# Patient Record
Sex: Female | Born: 1995 | Race: Black or African American | Hispanic: No | Marital: Single | Smoking: Never smoker
Health system: Southern US, Community
[De-identification: ages and names within clinical notes are randomized; demographics above are authoritative.]

## PROBLEM LIST (undated history)

## (undated) DIAGNOSIS — O98719 Human immunodeficiency virus [HIV] disease complicating pregnancy, unspecified trimester: Secondary | ICD-10-CM

## (undated) HISTORY — DX: Human immunodeficiency virus (HIV) disease complicating pregnancy, unspecified trimester: O98.719

---
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
## 2014-02-27 ENCOUNTER — Ambulatory Visit: Payer: Medicaid Other | Admitting: Infectious Diseases

## 2014-03-13 DIAGNOSIS — B20 Human immunodeficiency virus [HIV] disease: Secondary | ICD-10-CM

## 2014-06-15 DIAGNOSIS — B20 Human immunodeficiency virus [HIV] disease: Secondary | ICD-10-CM

## 2014-11-02 DIAGNOSIS — B20 Human immunodeficiency virus [HIV] disease: Secondary | ICD-10-CM

## 2015-02-14 DIAGNOSIS — B20 Human immunodeficiency virus [HIV] disease: Secondary | ICD-10-CM | POA: Diagnosis not present

## 2015-08-08 DIAGNOSIS — B20 Human immunodeficiency virus [HIV] disease: Secondary | ICD-10-CM | POA: Diagnosis not present

## 2015-08-30 ENCOUNTER — Encounter: Payer: Self-pay | Admitting: Infectious Disease

## 2015-12-03 DIAGNOSIS — B20 Human immunodeficiency virus [HIV] disease: Secondary | ICD-10-CM | POA: Diagnosis not present

## 2016-06-05 DIAGNOSIS — B20 Human immunodeficiency virus [HIV] disease: Secondary | ICD-10-CM | POA: Diagnosis not present

## 2016-11-17 NOTE — L&D Delivery Note (Signed)
  Patient is 21 y.o. G1P0 [redacted]w[redacted]d admitted in [facility], hx of HIV with undetectable viral load.   Delivery Note At 1:58 AM a viable female was delivered via Vaginal, Spontaneous Delivery (Presentation: ROA; vertex ).  APGAR: 9, 9; weight pending.   Placenta status: delivered spontaneously, in tact. Cord: 3vc with the following complications: none .  Cord pH: not sent  Anesthesia:  epidural Episiotomy: None Lacerations: Sulcus Suture Repair: 3.0 monocryl Est. Blood Loss (mL): 350  Mom to postpartum.  Baby to Couplet care / Skin to Skin.  Tillman Sers 07/13/2017, 2:30 AM      Upon arrival patient was complete and pushing. She pushed with good maternal effort to deliver a healthy baby boy. Baby delivered without difficulty, was noted to have good tone and place on maternal abdomen for oral suctioning, drying and stimulation. Delayed cord clamping performed. Placenta delivered intact with 3V cord. Vaginal canal and perineum was inspected and found to have sulcus tear; hemostatic upon repair. Pitocin was started and uterus massaged until bleeding slowed. Counts of sharps, instruments, and lap pads were all correct.   Tillman Sers, DO PGY-2 8/27/20182:30 AM

## 2016-12-10 DIAGNOSIS — B20 Human immunodeficiency virus [HIV] disease: Secondary | ICD-10-CM | POA: Diagnosis not present

## 2017-01-19 LAB — OB RESULTS CONSOLE HEPATITIS B SURFACE ANTIGEN: Hepatitis B Surface Ag: NEGATIVE

## 2017-01-19 LAB — HIV-1 RNA, QUALITATIVE, TMA: HIV-1 RNA, Qualitative, TMA: POSITIVE

## 2017-01-19 LAB — OB RESULTS CONSOLE HGB/HCT, BLOOD
HEMATOCRIT: 36 %
HEMOGLOBIN: 12 g/dL

## 2017-01-19 LAB — OB RESULTS CONSOLE RUBELLA ANTIBODY, IGM: RUBELLA: IMMUNE

## 2017-01-19 LAB — OB RESULTS CONSOLE ANTIBODY SCREEN: ANTIBODY SCREEN: NEGATIVE

## 2017-01-19 LAB — OB RESULTS CONSOLE HIV ANTIBODY (ROUTINE TESTING): HIV: REACTIVE

## 2017-01-19 LAB — OB RESULTS CONSOLE RPR: RPR: NONREACTIVE

## 2017-01-19 LAB — OB RESULTS CONSOLE ABO/RH: RH Type: POSITIVE

## 2017-01-19 LAB — OB RESULTS CONSOLE VARICELLA ZOSTER ANTIBODY, IGG: Varicella: NON-IMMUNE/NOT IMMUNE

## 2017-01-27 DIAGNOSIS — B20 Human immunodeficiency virus [HIV] disease: Secondary | ICD-10-CM | POA: Diagnosis not present

## 2017-02-12 ENCOUNTER — Telehealth: Payer: Self-pay | Admitting: *Deleted

## 2017-02-12 ENCOUNTER — Ambulatory Visit: Payer: Medicaid Other | Admitting: Internal Medicine

## 2017-02-12 DIAGNOSIS — O98719 Human immunodeficiency virus [HIV] disease complicating pregnancy, unspecified trimester: Secondary | ICD-10-CM

## 2017-02-12 NOTE — Telephone Encounter (Signed)
Patient left message in triage, apologized for not having a ride and cancelling her appointment today. She would like Dr Drue SecondSnider to know [facility]   She is trying to follow up on a OB referral here.  RN will place order for referral to high risk OB/GYN clinic - please cosign. Andree CossHowell, Patriciann Becht M, RN

## 2017-02-16 NOTE — Telephone Encounter (Signed)
I think Feliz Beam was working on the ob appt. Can you check with her.

## 2017-02-25 ENCOUNTER — Telehealth: Payer: Self-pay | Admitting: *Deleted

## 2017-02-25 NOTE — Telephone Encounter (Signed)
Information faxed to [facility] and awaiting an appointment for the patient.

## 2017-02-27 ENCOUNTER — Encounter: Payer: Self-pay | Admitting: *Deleted

## 2017-03-05 ENCOUNTER — Encounter: Payer: Self-pay | Admitting: *Deleted

## 2017-03-05 DIAGNOSIS — O0990 Supervision of high risk pregnancy, unspecified, unspecified trimester: Secondary | ICD-10-CM

## 2017-03-05 DIAGNOSIS — O98719 Human immunodeficiency virus [HIV] disease complicating pregnancy, unspecified trimester: Secondary | ICD-10-CM

## 2017-03-05 DIAGNOSIS — B20 Human immunodeficiency virus [HIV] disease: Secondary | ICD-10-CM | POA: Insufficient documentation

## 2017-03-05 HISTORY — DX: Human immunodeficiency virus (HIV) disease complicating pregnancy, unspecified trimester: O98.719

## 2017-03-11 DIAGNOSIS — B20 Human immunodeficiency virus [HIV] disease: Secondary | ICD-10-CM | POA: Diagnosis not present

## 2017-03-16 ENCOUNTER — Ambulatory Visit: Payer: Medicaid Other | Admitting: Internal Medicine

## 2017-03-17 ENCOUNTER — Ambulatory Visit: Payer: Medicaid Other | Admitting: Internal Medicine

## 2017-03-17 ENCOUNTER — Other Ambulatory Visit (HOSPITAL_COMMUNITY)
Admission: RE | Admit: 2017-03-17 | Discharge: 2017-03-17 | Disposition: A | Discharge status: Home / Self Care | Payer: Medicaid Other | Source: Ambulatory Visit | Attending: Obstetrics and Gynecology | Admitting: Obstetrics and Gynecology

## 2017-03-17 ENCOUNTER — Encounter: Payer: Self-pay | Admitting: Obstetrics and Gynecology

## 2017-03-17 ENCOUNTER — Ambulatory Visit (INDEPENDENT_AMBULATORY_CARE_PROVIDER_SITE_OTHER): Payer: Medicaid Other | Admitting: Obstetrics and Gynecology

## 2017-03-17 VITALS — BP 131/82 | HR 108 | Ht 63.0 in | Wt 163.1 lb

## 2017-03-17 DIAGNOSIS — O98719 Human immunodeficiency virus [HIV] disease complicating pregnancy, unspecified trimester: Secondary | ICD-10-CM

## 2017-03-17 DIAGNOSIS — Z3A21 21 weeks gestation of pregnancy: Secondary | ICD-10-CM | POA: Diagnosis not present

## 2017-03-17 DIAGNOSIS — Z113 Encounter for screening for infections with a predominantly sexual mode of transmission: Secondary | ICD-10-CM | POA: Diagnosis not present

## 2017-03-17 DIAGNOSIS — O0992 Supervision of high risk pregnancy, unspecified, second trimester: Secondary | ICD-10-CM

## 2017-03-17 DIAGNOSIS — O0990 Supervision of high risk pregnancy, unspecified, unspecified trimester: Secondary | ICD-10-CM

## 2017-03-17 DIAGNOSIS — Z21 Asymptomatic human immunodeficiency virus [HIV] infection status: Secondary | ICD-10-CM

## 2017-03-17 LAB — POCT URINALYSIS DIP (DEVICE)
Bilirubin Urine: NEGATIVE
GLUCOSE, UA: NEGATIVE mg/dL
Hgb urine dipstick: NEGATIVE
Nitrite: NEGATIVE
Protein, ur: NEGATIVE mg/dL
SPECIFIC GRAVITY, URINE: 1.02 (ref 1.005–1.030)
Urobilinogen, UA: 1 mg/dL (ref 0.0–1.0)
pH: 7 (ref 5.0–8.0)

## 2017-03-17 NOTE — Patient Instructions (Signed)

## 2017-03-17 NOTE — Progress Notes (Signed)
   PRENATAL VISIT NOTE  Subjective:  Beth George is a 21 y.o. G1P0 at [redacted]w[redacted]d being seen today for Initial  prenatal care.  She is currently monitored for the following issues for this high-risk pregnancy and has HIV disease affecting pregnancy and Supervision of high risk pregnancy, antepartum on her problem list.  Patient reports no complaints.  Contractions: Not present. Vag. Bleeding: None.  Movement: Present. Denies leaking of fluid.   Patient is HIV positive. She is on medication and following with ID at [facility].   The following portions of the patient's history were reviewed and updated as appropriate: allergies, current medications, past family history, past medical history, past social history, past surgical history and problem list. Problem list updated.  Objective:   Vitals:   03/17/17 1522 03/17/17 1524  BP: 131/82   Pulse: (!) 108   Weight: 163 lb 1.6 oz (74 kg)   Height:   (1.6 m)    Fetal Status: Fetal Heart Rate (bpm): 154   Movement: Present     General:  Alert, oriented and cooperative. Patient is in no acute distress.  Skin: Skin is warm and dry. No rash noted.   Cardiovascular: Normal heart rate noted  Respiratory: Normal respiratory effort, no problems with respiration noted  Abdomen: Soft, gravid, appropriate for gestational age. Pain/Pressure: Absent     Pelvic:  Cervical exam deferred        Extremities: Normal range of motion.  Edema: None  Mental Status: Normal mood and affect. Normal behavior. Normal judgment and thought content.   Assessment and Plan:  Pregnancy: G1P0 at [redacted]w[redacted]d  1. Supervision of high risk pregnancy, antepartum - GC/Chlamydia probe amp ([place])not at [facility] OB COMP + 14 WK; Future  2. HIV in pregnancy -on medication -CD4 count 586 -following at [facility]  Preterm labor symptoms and general obstetric precautions including but not limited to vaginal bleeding, contractions, leaking of fluid and  fetal movement were reviewed in detail with the patient. Please refer to After Visit Summary for other counseling recommendations.  No Follow-up on file.   Lorne Skeens, MD

## 2017-03-17 NOTE — Progress Notes (Signed)
Here for first visit- transferring care from [facility] clinic.  Given prenatal education booklets. Signed up for babyscripps app.

## 2017-03-20 LAB — GC/CHLAMYDIA PROBE AMP (~~LOC~~) NOT AT ARMC
Chlamydia: NEGATIVE
Neisseria Gonorrhea: NEGATIVE

## 2017-03-30 ENCOUNTER — Other Ambulatory Visit: Payer: Self-pay | Admitting: *Deleted

## 2017-03-30 ENCOUNTER — Encounter: Payer: Self-pay | Admitting: *Deleted

## 2017-03-30 DIAGNOSIS — O0992 Supervision of high risk pregnancy, unspecified, second trimester: Secondary | ICD-10-CM

## 2017-03-30 DIAGNOSIS — O98719 Human immunodeficiency virus [HIV] disease complicating pregnancy, unspecified trimester: Secondary | ICD-10-CM

## 2017-03-30 NOTE — Progress Notes (Signed)
[facility] pre[facility] US ordered ([id number] Ob complete +14 wks). The memo stated that their physician recommended a different study ([id number]  Ob detail +14 wks) based on clinical information provided. Per the [facility] website, the original study [phone]([id number]) has been denied for pre-authorization. I placed new order for study [id number] as recommended and completed pre-auth as well as notifying MFM dept.

## 2017-04-02 ENCOUNTER — Ambulatory Visit (HOSPITAL_COMMUNITY)
Admission: RE | Admit: 2017-04-02 | Discharge: 2017-04-02 | Disposition: A | Discharge status: Home / Self Care | Payer: Medicaid Other | Source: Ambulatory Visit | Attending: Obstetrics and Gynecology | Admitting: Obstetrics and Gynecology

## 2017-04-02 DIAGNOSIS — O98712 Human immunodeficiency virus [HIV] disease complicating pregnancy, second trimester: Secondary | ICD-10-CM | POA: Insufficient documentation

## 2017-04-02 DIAGNOSIS — Z363 Encounter for antenatal screening for malformations: Secondary | ICD-10-CM | POA: Insufficient documentation

## 2017-04-02 DIAGNOSIS — O98719 Human immunodeficiency virus [HIV] disease complicating pregnancy, unspecified trimester: Secondary | ICD-10-CM

## 2017-04-02 DIAGNOSIS — Z3A24 24 weeks gestation of pregnancy: Secondary | ICD-10-CM | POA: Insufficient documentation

## 2017-04-02 DIAGNOSIS — O0992 Supervision of high risk pregnancy, unspecified, second trimester: Secondary | ICD-10-CM | POA: Insufficient documentation

## 2017-04-15 ENCOUNTER — Ambulatory Visit (INDEPENDENT_AMBULATORY_CARE_PROVIDER_SITE_OTHER): Payer: Medicaid Other | Admitting: Advanced Practice Midwife

## 2017-04-15 ENCOUNTER — Encounter: Payer: Self-pay | Admitting: Advanced Practice Midwife

## 2017-04-15 ENCOUNTER — Ambulatory Visit (INDEPENDENT_AMBULATORY_CARE_PROVIDER_SITE_OTHER): Payer: Medicaid Other | Admitting: Clinical

## 2017-04-15 VITALS — BP 127/80 | HR 96 | Wt 167.5 lb

## 2017-04-15 DIAGNOSIS — Z048 Encounter for examination and observation for other specified reasons: Secondary | ICD-10-CM | POA: Diagnosis not present

## 2017-04-15 DIAGNOSIS — O0990 Supervision of high risk pregnancy, unspecified, unspecified trimester: Secondary | ICD-10-CM

## 2017-04-15 DIAGNOSIS — O99342 Other mental disorders complicating pregnancy, second trimester: Secondary | ICD-10-CM

## 2017-04-15 DIAGNOSIS — IMO0002 Reserved for concepts with insufficient information to code with codable children: Secondary | ICD-10-CM

## 2017-04-15 DIAGNOSIS — F4322 Adjustment disorder with anxiety: Secondary | ICD-10-CM | POA: Diagnosis not present

## 2017-04-15 DIAGNOSIS — F419 Anxiety disorder, unspecified: Secondary | ICD-10-CM

## 2017-04-15 DIAGNOSIS — Z0489 Encounter for examination and observation for other specified reasons: Secondary | ICD-10-CM

## 2017-04-15 DIAGNOSIS — O98712 Human immunodeficiency virus [HIV] disease complicating pregnancy, second trimester: Secondary | ICD-10-CM | POA: Diagnosis not present

## 2017-04-15 DIAGNOSIS — O0992 Supervision of high risk pregnancy, unspecified, second trimester: Secondary | ICD-10-CM | POA: Diagnosis not present

## 2017-04-15 DIAGNOSIS — O98719 Human immunodeficiency virus [HIV] disease complicating pregnancy, unspecified trimester: Secondary | ICD-10-CM

## 2017-04-15 DIAGNOSIS — Z21 Asymptomatic human immunodeficiency virus [HIV] infection status: Secondary | ICD-10-CM

## 2017-04-15 NOTE — BH Specialist Note (Addendum)
Integrated Behavioral Health Initial Visit  MRN: [id number] Name: Beth LopesJameka L George   Session Start time: 11:50 Session End time: 12:10 Total time: 20 minutes  Type of Service: Integrated Behavioral Health- Individual/Family Interpretor:No. Interpretor Name and Language: n/a   Warm Hand Off Completed.       SUBJECTIVE: Beth George is a 21 y.o. female accompanied by patient, mother and nephew. Patient was referred by Beth George, CNM for anxiety. Patient reports the following symptoms/concerns: Pt states her primary concern is an increase in irritability and anxiousness this pregnancy; open to learning new strategy to cope, and being prepared for birth and postpartum. Duration of problem: Current pregnancy; Severity of problem: moderate  OBJECTIVE: Mood: Anxious and Irritable and Affect: Tearful Risk of harm to self or others: No plan to harm self or others   LIFE CONTEXT: Family and Social: Lives with family/mother School/Work: - Self-Care: - Life Changes: Current first pregnancy  GOALS ADDRESSED: Patient will reduce symptoms of: anxiety and irritability and increase knowledge and/or ability of: self-management skills and also: Increase healthy adjustment to current life circumstances   INTERVENTIONS: Mindfulness or Relaxation Training and Psychoeducation and/or Health Education  Standardized Assessments completed: GAD-7 and PHQ 9  ASSESSMENT: Patient currently experiencing Adjustment disorder with anxious mood. Patient may benefit from psychoeducation and brief therapeutic intervention regarding coping with symptoms of anxiety.  PLAN: 1. Follow up with behavioral health clinician on : Two weeks 2. Behavioral recommendations:  -Practice CALM relaxation breathing exercise every morning -Read educational materials regarding coping with symptoms of anxiety (and depression) -Use Postpartum Planner with family at home, to help in planning ahead for  postpartum -Consider apps for additional self-coping with feelings 3. Referral(s): [facility]  Beth George, [place]  Dep[facility] screen [facility] 03/17/2017  Decreased Interest 1  Down, Depressed, Hopeless 1  PHQ - 2 Score 2  Altered sleeping 2  Tired, decreased energy 2  Change in appetite 3  Feeling bad or failure about yourself  0  Trouble concentrating 1  Moving slowly or fidgety/restless 0  Suicidal thoughts 0  PHQ-9 Score 10   GAD 7 : Generalized Anxiety Score 03/17/2017  Nervous, Anxious, on Edge 0  Control/stop worrying 0  Worry too much - different things 0  Trouble relaxing 0  Restless 1  Easily annoyed or irritable 1  Afraid - awful might happen 0  Total GAD 7 Score 2

## 2017-04-15 NOTE — Patient Instructions (Addendum)
AREA PEDIATRIC/FAMILY PRACTICE PHYSICIANS  [facility] [address], [address] [place], [place]  [address] Phone - [phone]   Fax - [phone]  [facility] [address] [place], [place] [address] Phone - [phone]   Fax - [phone]  JACK AMOS [address] [place], [place]  [address] Phone - [phone]   Fax - [phone]  [facility] [address] [place], [place]  [address] Phone - [phone]   Fax - [phone]  [facility] [address] [place], [place]  [address] Phone - [phone]   Fax - [phone]  [facility] [address] [place], [place]  [address] Phone - [phone]   Fax - [phone]  [facility] [place] [address] [place], [place]  [address] Phone - [phone]   Fax - [phone]  [facility] AT [facility] [address] [place], [address] [place], [place]  [address] Phone - [phone]   Fax - [phone]  [facility] AT [facility] [address] [place], [place]  [address] Phone - [phone]   Fax - [phone] [facility] [place] [address] [place], [place]  [address] Phone - [phone]   Fax - [phone]  [facility] [address] [place], [place]  [address] Phone - [phone]   Fax - [phone]  [facility] [address] [place], [place]  [address] Phone - [phone]   Fax - [phone]  [facility] [address] [place], [place]  [address] Phone - [phone]   Fax - [phone]  [facility] [address] [place], [place]  [address] Phone - [phone]  [facility] [address] [place], [place]  [address] Phone - [phone]   Fax - [phone]  [facility] [address] [place], [place]  [address] Phone - [phone]   Fax - [phone]  [facility] [address] [place], [place]  [address] Phone - [phone]   Fax - [phone]  [facility] [address] [place], [place]  [address] Phone - [phone]   Fax - [phone]   [facility] [address] [place], [place]  [address] Phone - [phone]   Fax - [phone]  [facility] [address] [place], [place]  [address] Phone - [phone]   Fax - [phone]  DAVID RUBIN [address] [place], [place]  [address] Phone - [phone]   Fax - [phone]  [facility] [address] [place], [place]  [address] Phone - [phone]   Fax - [phone]  [facility] Alita Chyle [address] [place], [place]  [address] Phone - [phone]   Fax - [phone] Gerarda Fraction [address] [place], [place]  [address] Phone - [phone]   Fax - [phone]  [facility] [address] [place], [place]  [address] Phone - [phone]   Fax - [phone]  [facility] -  [address] [place], [place]  [address] Phone - [phone]   Fax - [phone]  [facility] -  Wyvonne Lenz MD [address] [place] [address] Phone [phone]  Fax [phone]  Places to have your son circumcised:    [facility] [phone] $[phone] by 4  wks  [facility] [phone] $244 by 4 wks  [facility] [phone] $175 by 2 wks  [facility] [phone] $250 by 7 days [facility] [phone] $150 by 4 wks  These prices sometimes change but are roughly what you can expect to pay. Please call and  confirm pricing.   Circumcision is considered an elective/non-medically necessary procedure. There are many reasons parents decide to have their sons circumsized. During the first year of life circumcised males have a reduced risk of urinary tract infections but after this year the rates between circumcised males and uncircumcised males are the same.  It is safe to have your son circumcised outside of the hospital and the places above perform them regularly.     Third Trimester of Pregnancy The third trimester is from week 28 through week 40 (months 7 through 9). The third trimester is a time when the unborn baby (fetus) is growing rapidly. At the end of the ninth month, the fetus is about 20 inches in length and weighs 6-10 pounds. Body changes during your third trimester Your body will continue to go through many changes during pregnancy. The changes vary from woman to woman. During the third trimester:  Your weight will continue to increase. You can expect to gain 25-35 pounds (11-16 kg) by the end of the pregnancy.  You may begin to get stretch marks on your hips, abdomen, and breasts.  You may urinate more often because the fetus is moving lower into your pelvis and pressing on your bladder.  You may develop or continue to have heartburn. This is caused by increased hormones that slow down muscles in the digestive tract.  You may develop or continue to have constipation because increased hormones slow digestion and cause the muscles that push waste through your intestines to relax.  You may develop hemorrhoids. These are swollen veins (varicose veins) in the rectum that can itch or be painful.  You may develop swollen, bulging veins (varicose veins) in your legs.  You may have increased body aches in the pelvis, back, or thighs. This is due to weight gain and increased hormones that are relaxing your joints.  You may have changes in your hair. These can include thickening of your  hair, rapid growth, and changes in texture. Some women also have hair loss during or after pregnancy, or hair that feels dry or thin. Your hair will most likely return to normal after your baby is born.  Your breasts will continue to grow and they will continue to become tender. A yellow fluid (colostrum) may leak from your breasts. This is the first milk you are producing for your baby.  Your belly button may stick out.  You may notice more swelling in your hands, face, or ankles.  You may have increased tingling or numbness in your hands, arms, and legs. The skin on your belly may also feel numb.  You may feel short of breath because of your expanding uterus.  You may have more problems sleeping. This can be caused by the size of your belly, increased need to urinate, and an increase in your body's metabolism.  You may notice the fetus "dropping," or moving lower in your abdomen (lightening).  You may have increased vaginal discharge.  You may notice your joints feel loose and you may have pain around your pelvic bone. What to expect at prenatal visits You will have prenatal exams every 2 weeks until week 36. Then you will have weekly prenatal exams. During a routine  prenatal visit:  You will be weighed to make sure you and the baby are growing normally.  Your blood pressure will be taken.  Your abdomen will be measured to track your baby's growth.  The fetal heartbeat will be listened to.  Any test results from the previous visit will be discussed.  You may have a cervical check near your due date to see if your cervix has softened or thinned (effaced).  You will be tested for Group B streptococcus. This happens between 35 and 37 weeks. Your health care provider may ask you:  What your birth plan is.  How you are feeling.  If you are feeling the baby move.  If you have had any abnormal symptoms, such as leaking fluid, bleeding, severe headaches, or abdominal  cramping.  If you are using any tobacco products, including cigarettes, chewing tobacco, and electronic cigarettes.  If you have any questions. Other tests or screenings that may be performed during your third trimester include:  Blood tests that check for low iron levels (anemia).  Fetal testing to check the health, activity level, and growth of the fetus. Testing is done if you have certain medical conditions or if there are problems during the pregnancy.  Nonstress test (NST). This test checks the health of your baby to make sure there are no signs of problems, such as the baby not getting enough oxygen. During this test, a belt is placed around your belly. The baby is made to move, and its heart rate is monitored during movement. What is false labor? False labor is a condition in which you feel small, irregular tightenings of the muscles in the womb (contractions) that usually go away with rest, changing position, or drinking water. These are called Braxton Hicks contractions. Contractions may last for hours, days, or even weeks before true labor sets in. If contractions come at regular intervals, become more frequent, increase in intensity, or become painful, you should see your health care provider. What are the signs of labor?  Abdominal cramps.  Regular contractions that start at 10 minutes apart and become stronger and more frequent with time.  Contractions that start on the top of the uterus and spread down to the lower abdomen and back.  Increased pelvic pressure and dull back pain.  A watery or bloody mucus discharge that comes from the vagina.  Leaking of amniotic fluid. This is also known as your "water breaking." It could be a slow trickle or a gush. Let your health care provider know if it has a color or strange odor. If you have any of these signs, call your health care provider right away, even if it is before your due date. Follow these instructions at home: Medicines    Follow your health care provider's instructions regarding medicine use. Specific medicines may be either safe or unsafe to take during pregnancy.  Take a prenatal vitamin that contains at least 600 micrograms (mcg) of folic acid.  If you develop constipation, try taking a stool softener if your health care provider approves. Eating and drinking   Eat a balanced diet that includes fresh fruits and vegetables, whole grains, good sources of protein such as meat, eggs, or tofu, and low-fat dairy. Your health care provider will help you determine the amount of weight gain that is right for you.  Avoid raw meat and uncooked cheese. These carry germs that can cause birth defects in the baby.  If you have low calcium intake from food, talk  to your health care provider about whether you should take a daily calcium supplement.  Eat four or five small meals rather than three large meals a day.  Limit foods that are high in fat and processed sugars, such as fried and sweet foods.  To prevent constipation:  Drink enough fluid to keep your urine clear or pale yellow.  Eat foods that are high in fiber, such as fresh fruits and vegetables, whole grains, and beans. Activity   Exercise only as directed by your health care provider. Most women can continue their usual exercise routine during pregnancy. Try to exercise for 30 minutes at least 5 days a week. Stop exercising if you experience uterine contractions.  Avoid heavy lifting.  Do not exercise in extreme heat or humidity, or at high altitudes.  Wear low-heel, comfortable shoes.  Practice good posture.  You may continue to have sex unless your health care provider tells you otherwise. Relieving pain and discomfort   Take frequent breaks and rest with your legs elevated if you have leg cramps or low back pain.  Take warm sitz baths to soothe any pain or discomfort caused by hemorrhoids. Use hemorrhoid cream if your health care provider  approves.  Wear a good support bra to prevent discomfort from breast tenderness.  If you develop varicose veins:  Wear support pantyhose or compression stockings as told by your healthcare provider.  Elevate your feet for 15 minutes, 3-4 times a day. Prenatal care   Write down your questions. Take them to your prenatal visits.  Keep all your prenatal visits as told by your health care provider. This is important. Safety   Wear your seat belt at all times when driving.  Make a list of emergency phone numbers, including numbers for family, friends, the hospital, and police and fire departments. General instructions   Avoid cat litter boxes and soil used by cats. These carry germs that can cause birth defects in the baby. If you have a cat, ask someone to clean the litter box for you.  Do not travel far distances unless it is absolutely necessary and only with the approval of your health care provider.  Do not use hot tubs, steam rooms, or saunas.  Do not drink alcohol.  Do not use any products that contain nicotine or tobacco, such as cigarettes and e-cigarettes. If you need help quitting, ask your health care provider.  Do not use any medicinal herbs or unprescribed drugs. These chemicals affect the formation and growth of the baby.  Do not douche or use tampons or scented sanitary pads.  Do not cross your legs for long periods of time.  To prepare for the arrival of your baby:  Take prenatal classes to understand, practice, and ask questions about labor and delivery.  Make a trial run to the hospital.  Visit the hospital and tour the maternity area.  Arrange for maternity or paternity leave through employers.  Arrange for family and friends to take care of pets while you are in the hospital.  Purchase a rear-facing car seat and make sure you know how to install it in your car.  Pack your hospital bag.  Prepare the baby's nursery. Make sure to remove all pillows  and stuffed animals from the baby's crib to prevent suffocation.  Visit your dentist if you have not gone during your pregnancy. Use a soft toothbrush to brush your teeth and be gentle when you floss. Contact a health care provider if:  You  are unsure if you are in labor or if your water has broken.  You become dizzy.  You have mild pelvic cramps, pelvic pressure, or nagging pain in your abdominal area.  You have lower back pain.  You have persistent nausea, vomiting, or diarrhea.  You have an unusual or bad smelling vaginal discharge.  You have pain when you urinate. Get help right away if:  Your water breaks before 37 weeks.  You have regular contractions less than 5 minutes apart before 37 weeks.  You have a fever.  You are leaking fluid from your vagina.  You have spotting or bleeding from your vagina.  You have severe abdominal pain or cramping.  You have rapid weight loss or weight gain.  You have shortness of breath with chest pain.  You notice sudden or extreme swelling of your face, hands, ankles, feet, or legs.  Your baby makes fewer than 10 movements in 2 hours.  You have severe headaches that do not go away when you take medicine.  You have vision changes. Summary  The third trimester is from week 28 through week 40, months 7 through 9. The third trimester is a time when the unborn baby (fetus) is growing rapidly.  During the third trimester, your discomfort may increase as you and your baby continue to gain weight. You may have abdominal, leg, and back pain, sleeping problems, and an increased need to urinate.  During the third trimester your breasts will keep growing and they will continue to become tender. A yellow fluid (colostrum) may leak from your breasts. This is the first milk you are producing for your baby.  False labor is a condition in which you feel small, irregular tightenings of the muscles in the womb (contractions) that eventually go  away. These are called Braxton Hicks contractions. Contractions may last for hours, days, or even weeks before true labor sets in.  Signs of labor can include: abdominal cramps; regular contractions that start at 10 minutes apart and become stronger and more frequent with time; watery or bloody mucus discharge that comes from the vagina; increased pelvic pressure and dull back pain; and leaking of amniotic fluid. This information is not intended to replace advice given to you by your health care provider. Make sure you discuss any questions you have with your health care provider. Document Released: 10/28/2001 Document Revised: 04/10/2016 Document Reviewed: 01/04/2013 Elsevier Interactive Patient Education  2017 [employer].

## 2017-04-15 NOTE — Progress Notes (Signed)
   PRENATAL VISIT NOTE  Subjective:  Beth George is a 21 y.o. G1P0 at 365w0d being seen today for ongoing prenatal care.  She is currently monitored for the following issues for this high-risk pregnancy and has HIV disease affecting pregnancy and Supervision of high risk pregnancy, antepartum on her problem list.  Patient reports boil on abd that popped.  Depression screening negative, but pt voiced crying easily, being more grumpy and wants to talk to [place].  Asking about waterbirth,  Contractions: Not present. Vag. Bleeding: None.  Movement: Present. Denies leaking of fluid.   The following portions of the patient's history were reviewed and updated as appropriate: allergies, current medications, past family history, past medical history, past social history, past surgical history and problem list. Problem list updated.  Objective:   Vitals:   04/15/17 1100  BP: 127/80  Pulse: 96  Weight: 167 lb 8 oz (76 kg)    Fetal Status: Fetal Heart Rate (bpm): 140 Fundal Height: 27 cm Movement: Present     General:  Alert, oriented and cooperative. Patient is in no acute distress.  Skin: Skin is warm and dry. No rash noted.   Cardiovascular: Normal heart rate noted  Respiratory: Normal respiratory effort, no problems with respiration noted  Abdomen: Soft, gravid, appropriate for gestational age. Pain/Pressure: Absent     Pelvic:  Cervical exam deferred        Extremities: Normal range of motion.  Edema: None  Mental Status: Normal mood and affect. Normal behavior. Normal judgment and thought content.   Assessment and Plan:  Pregnancy: G1P0 at 605w0d  1. Supervision of high risk pregnancy, antepartum - Warm hand-off to [place]   2. HIV disease affecting pregnancy, antepartum - Will call to reschedule appt w/ Dr. Drue SecondSnider [facility].  - US MFM OB FOLLOW UP; Future - Not a candidate for waterbirth, but discussed other options for comfort measures in labor. Recommend CBE, practice relaxation  techniques   3. Evaluate anatomy not seen on prior sonogram  - US MFM OB FOLLOW UP; Future    Preterm labor symptoms and general obstetric precautions including but not limited to vaginal bleeding, contractions, leaking of fluid and fetal movement were reviewed in detail with the patient. Please refer to After Visit Summary for other counseling recommendations.  Return in about 2 weeks (around 04/29/2017) for ROB/GTT.   Dorathy KinsmanVirginia Shanoah Asbill, CNM

## 2017-04-30 ENCOUNTER — Institutional Professional Consult (permissible substitution): Payer: Medicaid Other

## 2017-04-30 ENCOUNTER — Encounter: Payer: Medicaid Other | Admitting: Medical

## 2017-05-01 ENCOUNTER — Institutional Professional Consult (permissible substitution): Payer: Medicaid Other

## 2017-05-01 ENCOUNTER — Ambulatory Visit (HOSPITAL_COMMUNITY): Payer: Medicaid Other

## 2017-05-04 ENCOUNTER — Encounter (HOSPITAL_COMMUNITY): Payer: Self-pay

## 2017-05-04 ENCOUNTER — Ambulatory Visit (HOSPITAL_COMMUNITY)
Admission: RE | Admit: 2017-05-04 | Discharge: 2017-05-04 | Disposition: A | Discharge status: Home / Self Care | Payer: Medicaid Other | Source: Ambulatory Visit | Attending: Advanced Practice Midwife | Admitting: Advanced Practice Midwife

## 2017-05-04 ENCOUNTER — Encounter: Payer: Self-pay | Admitting: Advanced Practice Midwife

## 2017-05-04 ENCOUNTER — Other Ambulatory Visit: Payer: Self-pay | Admitting: Advanced Practice Midwife

## 2017-05-04 ENCOUNTER — Other Ambulatory Visit (HOSPITAL_COMMUNITY): Payer: Self-pay | Admitting: *Deleted

## 2017-05-04 ENCOUNTER — Ambulatory Visit (INDEPENDENT_AMBULATORY_CARE_PROVIDER_SITE_OTHER): Payer: Medicaid Other | Admitting: Advanced Practice Midwife

## 2017-05-04 VITALS — BP 128/78 | HR 108 | Wt 168.5 lb

## 2017-05-04 DIAGNOSIS — Z23 Encounter for immunization: Secondary | ICD-10-CM | POA: Diagnosis not present

## 2017-05-04 DIAGNOSIS — Z0489 Encounter for examination and observation for other specified reasons: Secondary | ICD-10-CM

## 2017-05-04 DIAGNOSIS — O09893 Supervision of other high risk pregnancies, third trimester: Secondary | ICD-10-CM | POA: Insufficient documentation

## 2017-05-04 DIAGNOSIS — O0992 Supervision of high risk pregnancy, unspecified, second trimester: Secondary | ICD-10-CM

## 2017-05-04 DIAGNOSIS — Z362 Encounter for other antenatal screening follow-up: Secondary | ICD-10-CM | POA: Diagnosis present

## 2017-05-04 DIAGNOSIS — IMO0002 Reserved for concepts with insufficient information to code with codable children: Secondary | ICD-10-CM

## 2017-05-04 DIAGNOSIS — O98719 Human immunodeficiency virus [HIV] disease complicating pregnancy, unspecified trimester: Secondary | ICD-10-CM | POA: Diagnosis not present

## 2017-05-04 DIAGNOSIS — O219 Vomiting of pregnancy, unspecified: Secondary | ICD-10-CM | POA: Diagnosis not present

## 2017-05-04 DIAGNOSIS — B20 Human immunodeficiency virus [HIV] disease: Secondary | ICD-10-CM | POA: Insufficient documentation

## 2017-05-04 DIAGNOSIS — O98713 Human immunodeficiency virus [HIV] disease complicating pregnancy, third trimester: Secondary | ICD-10-CM | POA: Diagnosis present

## 2017-05-04 DIAGNOSIS — O0990 Supervision of high risk pregnancy, unspecified, unspecified trimester: Secondary | ICD-10-CM

## 2017-05-04 DIAGNOSIS — Z3A28 28 weeks gestation of pregnancy: Secondary | ICD-10-CM | POA: Diagnosis not present

## 2017-05-04 DIAGNOSIS — Z21 Asymptomatic human immunodeficiency virus [HIV] infection status: Principal | ICD-10-CM

## 2017-05-04 MED ORDER — PROMETHAZINE HCL 12.5 MG PO TABS: 25.0000 mg | ORAL_TABLET | Freq: Four times a day (QID) | ORAL | 1 refills | Status: DC | PRN

## 2017-05-04 NOTE — Progress Notes (Signed)
   PRENATAL VISIT NOTE  Subjective:  Beth George is a 21 y.o. G1P0 at 7967w5d being seen today for ongoing prenatal care.  She is currently monitored for the following issues for this high-risk pregnancy and has HIV disease affecting pregnancy and Supervision of high risk pregnancy, antepartum on her problem list.  Patient reports no complaints.  Contractions: Not present. Vag. Bleeding: None.  Movement: Present. Denies leaking of fluid.   The following portions of the patient's history were reviewed and updated as appropriate: allergies, current medications, past family history, past medical history, past social history, past surgical history and problem list. Problem list updated.  Objective:   Vitals:   05/04/17 0838  BP: 128/78  Pulse: (!) 108  Weight: 168 lb 8 oz (76.4 kg)    Fetal Status: Fetal Heart Rate (bpm): 136 Fundal Height: 29 cm Movement: Present  Presentation: Vertex  General:  Alert, oriented and cooperative. Patient is in no acute distress.  Skin: Skin is warm and dry. No rash noted.   Cardiovascular: Normal heart rate noted  Respiratory: Normal respiratory effort, no problems with respiration noted  Abdomen: Soft, gravid, appropriate for gestational age. Pain/Pressure: Present     Pelvic:  Cervical exam deferred        Extremities: Normal range of motion.  Edema: Trace  Mental Status: Normal mood and affect. Normal behavior. Normal judgment and thought content.   Assessment and Plan:  Pregnancy: G1P0 at 2367w5d  Supervision high risk pregnancy  - Vomited GTT. Will Rx Phenergan and redo.  - TDaP  HIV - Encouraged to schedule appt w/ Dr. Drue SecondSnider ([facility]) to check HIV labs.    Preterm labor symptoms and general obstetric precautions including but not limited to vaginal bleeding, contractions, leaking of fluid and fetal movement were reviewed in detail with the patient. Please refer to After Visit Summary for other counseling recommendations.  Return in 2 weeks (on  05/18/2017).   Dorathy KinsmanVirginia Richard Holz, CNM

## 2017-05-04 NOTE — Patient Instructions (Addendum)
Contraception Choices Contraception (birth control) is the use of any methods or devices to prevent pregnancy. Below are some methods to help avoid pregnancy. Hormonal methods  Contraceptive implant. This is a thin, plastic tube containing progesterone hormone. It does not contain estrogen hormone. Your health care provider inserts the tube in the inner part of the upper arm. The tube can remain in place for up to 3 years. After 3 years, the implant must be removed. The implant prevents the ovaries from releasing an egg (ovulation), thickens the cervical mucus to prevent sperm from entering the uterus, and thins the lining of the inside of the uterus.  Progesterone-only injections. These injections are given every 3 months by your health care provider to prevent pregnancy. This synthetic progesterone hormone stops the ovaries from releasing eggs. It also thickens cervical mucus and changes the uterine lining. This makes it harder for sperm to survive in the uterus.  Birth control pills. These pills contain estrogen and progesterone hormone. They work by preventing the ovaries from releasing eggs (ovulation). They also cause the cervical mucus to thicken, preventing the sperm from entering the uterus. Birth control pills are prescribed by a health care provider.Birth control pills can also be used to treat heavy periods.  Minipill. This type of birth control pill contains only the progesterone hormone. They are taken every day of each month and must be prescribed by your health care provider.  Birth control patch. The patch contains hormones similar to those in birth control pills. It must be changed once a week and is prescribed by a health care provider.  Vaginal ring. The ring contains hormones similar to those in birth control pills. It is left in the vagina for 3 weeks, removed for 1 week, and then a new one is put back in place. The patient must be comfortable inserting and removing the ring from  the vagina.A health care provider's prescription is necessary.  Emergency contraception. Emergency contraceptives prevent pregnancy after unprotected sexual intercourse. This pill can be taken right after sex or up to 5 days after unprotected sex. It is most effective the sooner you take the pills after having sexual intercourse. Most emergency contraceptive pills are available without a prescription. Check with your pharmacist. Do not use emergency contraception as your only form of birth control. Barrier methods  Female condom. This is a thin sheath (latex or rubber) that is worn over the penis during sexual intercourse. It can be used with spermicide to increase effectiveness.  Female condom. This is a soft, loose-fitting sheath that is put into the vagina before sexual intercourse.  Diaphragm. This is a soft, latex, dome-shaped barrier that must be fitted by a health care provider. It is inserted into the vagina, along with a spermicidal jelly. It is inserted before intercourse. The diaphragm should be left in the vagina for 6 to 8 hours after intercourse.  Cervical cap. This is a round, soft, latex or plastic cup that fits over the cervix and must be fitted by a health care provider. The cap can be left in place for up to 48 hours after intercourse.  Sponge. This is a soft, circular piece of polyurethane foam. The sponge has spermicide in it. It is inserted into the vagina after wetting it and before sexual intercourse.  Spermicides. These are chemicals that kill or block sperm from entering the cervix and uterus. They come in the form of creams, jellies, suppositories, foam, or tablets. They do not require a prescription. They   are inserted into the vagina with an applicator before having sexual intercourse. The process must be repeated every time you have sexual intercourse. Intrauterine contraception  Intrauterine device (IUD). This is a T-shaped device that is put in a woman's uterus during  a menstrual period to prevent pregnancy. There are 2 types: ? Copper IUD. This type of IUD is wrapped in copper wire and is placed inside the uterus. Copper makes the uterus and fallopian tubes produce a fluid that kills sperm. It can stay in place for 10 years. ? Hormone IUD. This type of IUD contains the hormone progestin (synthetic progesterone). The hormone thickens the cervical mucus and prevents sperm from entering the uterus, and it also thins the uterine lining to prevent implantation of a fertilized egg. The hormone can weaken or kill the sperm that get into the uterus. It can stay in place for 3-5 years, depending on which type of IUD is used. Permanent methods of contraception  Female tubal ligation. This is when the woman's fallopian tubes are surgically sealed, tied, or blocked to prevent the egg from traveling to the uterus.  Hysteroscopic sterilization. This involves placing a small coil or insert into each fallopian tube. Your doctor uses a technique called hysteroscopy to do the procedure. The device causes scar tissue to form. This results in permanent blockage of the fallopian tubes, so the sperm cannot fertilize the egg. It takes about 3 months after the procedure for the tubes to become blocked. You must use another form of birth control for these 3 months.  Female sterilization. This is when the female has the tubes that carry sperm tied off (vasectomy).This blocks sperm from entering the vagina during sexual intercourse. After the procedure, the man can still ejaculate fluid (semen). Natural planning methods  Natural family planning. This is not having sexual intercourse or using a barrier method (condom, diaphragm, cervical cap) on days the woman could become pregnant.  Calendar method. This is keeping track of the length of each menstrual cycle and identifying when you are fertile.  Ovulation method. This is avoiding sexual intercourse during ovulation.  Symptothermal method.  This is avoiding sexual intercourse during ovulation, using a thermometer and ovulation symptoms.  Post-ovulation method. This is timing sexual intercourse after you have ovulated. Regardless of which type or method of contraception you choose, it is important that you use condoms to protect against the transmission of sexually transmitted infections (STIs). Talk with your health care provider about which form of contraception is most appropriate for you. This information is not intended to replace advice given to you by your health care provider. Make sure you discuss any questions you have with your health care provider. Document Released: 11/03/2005 Document Revised: 04/10/2016 Document Reviewed: 04/28/2013 Elsevier Interactive Patient Education  2017 Elsevier Inc.   Tdap Vaccine (Tetanus, Diphtheria and Pertussis): What You Need to Know 1. Why get vaccinated? Tetanus, diphtheria and pertussis are very serious diseases. Tdap vaccine can protect us from these diseases. And, Tdap vaccine given to pregnant women can protect newborn babies against pertussis. TETANUS (Lockjaw) is rare in the [place] today. It causes painful muscle tightening and stiffness, usually all over the body.  It can lead to tightening of muscles in the head and neck so you can't open your mouth, swallow, or sometimes even breathe. Tetanus kills about 1 out of 10 people who are infected even after receiving the best medical care.  DIPHTHERIA is also rare in the [place] today. It can   cause a thick coating to form in the back of the throat.  It can lead to breathing problems, heart failure, paralysis, and death.  PERTUSSIS (Whooping Cough) causes severe coughing spells, which can cause difficulty breathing, vomiting and disturbed sleep.  It can also lead to weight loss, incontinence, and rib fractures. Up to 2 in 100 adolescents and 5 in 100 adults with pertussis are hospitalized or have complications, which  could include pneumonia or death.  These diseases are caused by bacteria. Diphtheria and pertussis are spread from person to person through secretions from coughing or sneezing. Tetanus enters the body through cuts, scratches, or wounds. Before vaccines, as many as 200,000 cases of diphtheria, 200,000 cases of pertussis, and hundreds of cases of tetanus, were reported in the [place] each year. Since vaccination began, reports of cases for tetanus and diphtheria have dropped by about 99% and for pertussis by about 80%. 2. Tdap vaccine Tdap vaccine can protect adolescents and adults from tetanus, diphtheria, and pertussis. One dose of Tdap is routinely given at age 11 or 12. People who did not get Tdap at that age should get it as soon as possible. Tdap is especially important for healthcare professionals and anyone having close contact with a baby younger than 12 months. Pregnant women should get a dose of Tdap during every pregnancy, to protect the newborn from pertussis. Infants are most at risk for severe, life-threatening complications from pertussis. Another vaccine, called Td, protects against tetanus and diphtheria, but not pertussis. A Td booster should be given every 10 years. Tdap may be given as one of these boosters if you have never gotten Tdap before. Tdap may also be given after a severe cut or burn to prevent tetanus infection. Your doctor or the person giving you the vaccine can give you more information. Tdap may safely be given at the same time as other vaccines. 3. Some people should not get this vaccine  A person who has ever had a life-threatening allergic reaction after a previous dose of any diphtheria, tetanus or pertussis containing vaccine, OR has a severe allergy to any part of this vaccine, should not get Tdap vaccine. Tell the person giving the vaccine about any severe allergies.  Anyone who had coma or long repeated seizures within 7 days after a childhood dose of  DTP or DTaP, or a previous dose of Tdap, should not get Tdap, unless a cause other than the vaccine was found. They can still get Td.  Talk to your doctor if you: ? have seizures or another nervous system problem, ? had severe pain or swelling after any vaccine containing diphtheria, tetanus or pertussis, ? ever had a condition called Guillain-Barr Syndrome (GBS), ? aren't feeling well on the day the shot is scheduled. 4. Risks With any medicine, including vaccines, there is a chance of side effects. These are usually mild and go away on their own. Serious reactions are also possible but are rare. Most people who get Tdap vaccine do not have any problems with it. Mild problems following Tdap: (Did not interfere with activities)  Pain where the shot was given (about 3 in 4 adolescents or 2 in 3 adults)  Redness or swelling where the shot was given (about 1 person in 5)  Mild fever of at least 100.4F (up to about 1 in 25 adolescents or 1 in 100 adults)  Headache (about 3 or 4 people in 10)  Tiredness (about 1 person in 3 or 4)  Nausea,   vomiting, diarrhea, stomach ache (up to 1 in 4 adolescents or 1 in 10 adults)  Chills, sore joints (about 1 person in 10)  Body aches (about 1 person in 3 or 4)  Rash, swollen glands (uncommon)  Moderate problems following Tdap: (Interfered with activities, but did not require medical attention)  Pain where the shot was given (up to 1 in 5 or 6)  Redness or swelling where the shot was given (up to about 1 in 16 adolescents or 1 in 12 adults)  Fever over 102F (about 1 in 100 adolescents or 1 in 250 adults)  Headache (about 1 in 7 adolescents or 1 in 10 adults)  Nausea, vomiting, diarrhea, stomach ache (up to 1 or 3 people in 100)  Swelling of the entire arm where the shot was given (up to about 1 in 500).  Severe problems following Tdap: (Unable to perform usual activities; required medical attention)  Swelling, severe pain, bleeding  and redness in the arm where the shot was given (rare).  Problems that could happen after any vaccine:  People sometimes faint after a medical procedure, including vaccination. Sitting or lying down for about 15 minutes can help prevent fainting, and injuries caused by a fall. Tell your doctor if you feel dizzy, or have vision changes or ringing in the ears.  Some people get severe pain in the shoulder and have difficulty moving the arm where a shot was given. This happens very rarely.  Any medication can cause a severe allergic reaction. Such reactions from a vaccine are very rare, estimated at fewer than 1 in a million doses, and would happen within a few minutes to a few hours after the vaccination. As with any medicine, there is a very remote chance of a vaccine causing a serious injury or death. The safety of vaccines is always being monitored. For more information, visit: www.cdc.gov/vaccinesafety/ 5. What if there is a serious problem? What should I look for? Look for anything that concerns you, such as signs of a severe allergic reaction, very high fever, or unusual behavior. Signs of a severe allergic reaction can include hives, swelling of the face and throat, difficulty breathing, a fast heartbeat, dizziness, and weakness. These would usually start a few minutes to a few hours after the vaccination. What should I do?  If you think it is a severe allergic reaction or other emergency that can't wait, call 9-1-1 or get the person to the nearest hospital. Otherwise, call your doctor.  Afterward, the reaction should be reported to the Vaccine Adverse Event Reporting System (VAERS). Your doctor might file this report, or you can do it yourself through the VAERS web site at www.vaers.hhs.gov, or by calling 1-800-822-7967. ? VAERS does not give medical advice. 6. The National Vaccine Injury Compensation Program The National Vaccine Injury Compensation Program (VICP) is a federal program that  was created to compensate people who may have been injured by certain vaccines. Persons who believe they may have been injured by a vaccine can learn about the program and about filing a claim by calling 1-800-338-2382 or visiting the VICP website at www.hrsa.gov/vaccinecompensation. There is a time limit to file a claim for compensation. 7. How can I learn more?  Ask your doctor. He or she can give you the vaccine package insert or suggest other sources of information.  Call your local or state health department.  Contact the Centers for Disease Control and Prevention (CDC): ? Call 1-800-232-4636 (1-800-CDC-INFO) or ? Visit CDC's website   at www.cdc.gov/vaccines CDC Tdap Vaccine VIS (01/10/14) This information is not intended to replace advice given to you by your health care provider. Make sure you discuss any questions you have with your health care provider. Document Released: 05/04/2012 Document Revised: 07/24/2016 Document Reviewed: 07/24/2016 Elsevier Interactive Patient Education  2017 Elsevier Inc.   

## 2017-05-04 NOTE — Addendum Note (Signed)
Addended by: Lorelle GibbsWILSON, CHIQUITA L on: 05/04/2017 10:06 AM   Modules accepted: Orders

## 2017-05-05 LAB — CBC
HEMOGLOBIN: 9.2 g/dL — AB (ref 11.1–15.9)
Hematocrit: 28.5 % — ABNORMAL LOW (ref 34.0–46.6)
MCH: 28.8 pg (ref 26.6–33.0)
MCHC: 32.3 g/dL (ref 31.5–35.7)
MCV: 89 fL (ref 79–97)
Platelets: 271 10*3/uL (ref 150–379)
RBC: 3.2 x10E6/uL — AB (ref 3.77–5.28)
RDW: 13.3 % (ref 12.3–15.4)
WBC: 7.3 10*3/uL (ref 3.4–10.8)

## 2017-05-05 LAB — HIV 1/2 AB DIFFERENTIATION
HIV 1 Ab: POSITIVE — AB
HIV 2 Ab: NEGATIVE
NOTE (HIV CONF MULTIP: POSITIVE

## 2017-05-05 LAB — HIV ANTIBODY (ROUTINE TESTING W REFLEX): HIV SCREEN 4TH GENERATION: REACTIVE — AB

## 2017-05-05 LAB — RPR: RPR: NONREACTIVE

## 2017-05-08 ENCOUNTER — Telehealth: Payer: Self-pay | Admitting: *Deleted

## 2017-05-08 NOTE — Telephone Encounter (Signed)
Patient left message. [place] in clinic, was told she could have labs draw at [facility], and they can fax us results. The provider wants to know exactly what to order so the blood can get drawn. Please return call and let her know.

## 2017-05-18 ENCOUNTER — Ambulatory Visit (INDEPENDENT_AMBULATORY_CARE_PROVIDER_SITE_OTHER): Payer: Medicaid Other | Admitting: Clinical

## 2017-05-18 ENCOUNTER — Encounter: Payer: Self-pay | Admitting: Advanced Practice Midwife

## 2017-05-18 ENCOUNTER — Ambulatory Visit (INDEPENDENT_AMBULATORY_CARE_PROVIDER_SITE_OTHER): Payer: Medicaid Other | Admitting: Advanced Practice Midwife

## 2017-05-18 VITALS — BP 117/87 | HR 112 | Wt 168.8 lb

## 2017-05-18 DIAGNOSIS — F4322 Adjustment disorder with anxiety: Secondary | ICD-10-CM

## 2017-05-18 DIAGNOSIS — O0993 Supervision of high risk pregnancy, unspecified, third trimester: Secondary | ICD-10-CM | POA: Diagnosis not present

## 2017-05-18 DIAGNOSIS — O0990 Supervision of high risk pregnancy, unspecified, unspecified trimester: Secondary | ICD-10-CM

## 2017-05-18 NOTE — BH Specialist Note (Signed)
Integrated Behavioral Health Follow Up Visit  MRN: [id number] Name: Beth LopesJameka L Hannum   Session Start time: 10:20 Session End time: 10:40 Total time: 20 minutes Number of Integrated Behavioral Health Clinician visits: 2/10  Type of Service: Integrated Behavioral Health- Individual/Family Interpretor:No. Interpretor Name and Language: n/a   Warm Hand Off Completed.       SUBJECTIVE: Beth George is a 21 y.o. female accompanied by patient, mother, sister and niece. Patient was referred by Dorathy KinsmanVirginia Smith, CNM for anxiety. Patient reports the following symptoms/concerns: Pt states she is still "a little" worried about childbirth, but feels more prepared since last visit; is using breathing exercises to cope with stress, and is using postpartum planner to prepare. Duration of problem: Decrease in past month; Severity of problem: mild  OBJECTIVE: Mood: Anxious and Affect: Appropriate Risk of harm to self or others: No plan to harm self or others   LIFE CONTEXT: Family and Social: Lives with family/mother; mother is currently uninsured and untreated for bipolar disorder and schizophrenia School/Work: - Self-Care: Breathing exercises Life Changes: Current first pregnancy  GOALS ADDRESSED: Patient will reduce symptoms of: anxiety and increase knowledge and/or ability of: stress reduction and also: Increase healthy adjustment to current life circumstances and Increase adequate support systems for patient/family  INTERVENTIONS: Psychoeducation and/or Health Education and Link to [facility] Standardized Assessments completed: GAD-7 and PHQ 9  ASSESSMENT: Patient currently experiencing Adjustment disorder with anxious mood. Patient may benefit from additional psychoeducation and brief therapeutic interventions regarding coping with symptoms of anxiety.  PLAN: 1. Follow up with behavioral health clinician on : As needed 2. Behavioral recommendations:  -Continue using  relaxation breathing exercises daily -Continue using Postpartum planner as guide -Begin taking prenatal vitamins, as recommended by medical provider -Consider [facility] Family-to-Family classes for self and sister, for additional family support 3. Referral(s): Integrated Art gallery managerBehavioral Health Services (In Clinic) and Community Resources:  [facility] Family-to-family support 4. "From scale of 1-10, how likely are you to follow plan?": 9  Rae LipsJamie C Malory Spurr, LCSWA  Dep[facility] screen [facility] 05/18/2017 03/17/2017  Decreased Interest 1 1  Down, Depressed, Hopeless 0 1  PHQ - 2 Score 1 2  Altered sleeping 1 2  Tired, decreased energy 2 2  Change in appetite 1 3  Feeling bad or failure about yourself  0 0  Trouble concentrating 0 1  Moving slowly or fidgety/restless 1 0  Suicidal thoughts 0 0  PHQ-9 Score 6 10  ' GAD 7 : Generalized Anxiety Score 05/18/2017 03/17/2017  Nervous, Anxious, on Edge 1 0  Control/stop worrying 0 0  Worry too much - different things 0 0  Trouble relaxing 1 0  Restless 1 1  Easily annoyed or irritable 1 1  Afraid - awful might happen 0 0  Total GAD 7 Score 4 2

## 2017-05-18 NOTE — Progress Notes (Signed)
   PRENATAL VISIT NOTE  Subjective:  Beth George is a 21 y.o. G1P0 at 6112w5d being seen today for ongoing prenatal care.  She is currently monitored for the following issues for this high-risk pregnancy and has HIV disease affecting pregnancy and Supervision of high risk pregnancy, antepartum on her problem list.  Patient reports occasional contractions, Max 3 per hour.  Contractions: Irregular. Vag. Bleeding: None.  Movement: Present. Denies leaking of fluid.   The following portions of the patient's history were reviewed and updated as appropriate: allergies, current medications, past family history, past medical history, past social history, past surgical history and problem list. Problem list updated.  Objective:   Vitals:   05/18/17 0852  BP: 117/87  Pulse: (!) 112  Weight: 168 lb 12.8 oz (76.6 kg)    Fetal Status: Fetal Heart Rate (bpm): 138 Fundal Height: 31 cm Movement: Present  Presentation: Vertex  General:  Alert, oriented and cooperative. Patient is in no acute distress.  Skin: Skin is warm and dry. No rash noted.   Cardiovascular: Normal heart rate noted  Respiratory: Normal respiratory effort, no problems with respiration noted  Abdomen: Soft, gravid, appropriate for gestational age. Pain/Pressure: Present     Pelvic:  Cervical exam deferred        Extremities: Normal range of motion.  Edema: Trace  Mental Status: Normal mood and affect. Normal behavior. Normal judgment and thought content.   Assessment and Plan:  Pregnancy: G1P0 at 3012w5d  1. Supervision of high risk pregnancy, antepartum - 1 hour GTT today.  Preterm labor symptoms and general obstetric precautions including but not limited to vaginal bleeding, contractions, leaking of fluid and fetal movement were reviewed in detail with the patient. Please refer to After Visit Summary for other counseling recommendations.  Return in about 2 weeks (around 06/01/2017) for ROB.   Dorathy KinsmanVirginia Vernida Mcnicholas, CNM

## 2017-05-18 NOTE — Addendum Note (Signed)
Addended by: Lorelle GibbsWILSON, Elester Apodaca L on: 05/18/2017 10:45 AM   Modules accepted: Orders

## 2017-05-18 NOTE — Telephone Encounter (Signed)
See below

## 2017-05-18 NOTE — Progress Notes (Signed)
Is not fasting today, would like to try jelly bean gtt test . States feels contractions

## 2017-05-19 ENCOUNTER — Encounter: Payer: Self-pay | Admitting: Advanced Practice Midwife

## 2017-05-19 DIAGNOSIS — O99019 Anemia complicating pregnancy, unspecified trimester: Secondary | ICD-10-CM | POA: Insufficient documentation

## 2017-05-19 LAB — GLUCOSE TOLERANCE, 1 HOUR: Glucose, 1Hr PP: 112 mg/dL (ref 65–199)

## 2017-05-29 ENCOUNTER — Telehealth: Payer: Self-pay | Admitting: *Deleted

## 2017-05-29 NOTE — Telephone Encounter (Signed)
Pt left message requesting results of GTT and also she was supposed to receive a work restriction letter @ her last visit 7/2.  I called pt and informed her that the work letter was prepared on 7/2.  She stated she will pick it up on 7/18. I also informed pt of normal GTT result - no Gestational Diabetes.  Pt voiced understanding.

## 2017-06-03 ENCOUNTER — Ambulatory Visit (INDEPENDENT_AMBULATORY_CARE_PROVIDER_SITE_OTHER): Payer: Medicaid Other | Admitting: Certified Nurse Midwife

## 2017-06-03 ENCOUNTER — Encounter: Payer: Self-pay | Admitting: Certified Nurse Midwife

## 2017-06-03 VITALS — BP 139/73 | HR 101 | Wt 170.0 lb

## 2017-06-03 DIAGNOSIS — O0993 Supervision of high risk pregnancy, unspecified, third trimester: Secondary | ICD-10-CM | POA: Diagnosis not present

## 2017-06-03 DIAGNOSIS — O98713 Human immunodeficiency virus [HIV] disease complicating pregnancy, third trimester: Secondary | ICD-10-CM | POA: Diagnosis not present

## 2017-06-03 DIAGNOSIS — O0990 Supervision of high risk pregnancy, unspecified, unspecified trimester: Secondary | ICD-10-CM

## 2017-06-03 NOTE — Progress Notes (Signed)
Subjective:  Beth LopesJameka L George is a 21 y.o. G1P0 at 8619w0d being seen today for ongoing prenatal care.  She is currently monitored for the following issues for this high-risk pregnancy and has HIV disease affecting pregnancy; Supervision of high risk pregnancy, antepartum; and Anemia in pregnancy on her problem list.  Patient reports no complaints.  Contractions: Irregular. Vag. Bleeding: None.  Movement: Present. Denies leaking of fluid.   The following portions of the patient's history were reviewed and updated as appropriate: allergies, current medications, past family history, past medical history, past social history, past surgical history and problem list. Problem list updated.  Objective:   Vitals:   06/03/17 0911  BP: 139/73  Pulse: (!) 101  Weight: 170 lb (77.1 kg)    Fetal Status: Fetal Heart Rate (bpm): 145 Fundal Height: 34 cm Movement: Present  Presentation: Vertex  General:  Alert, oriented and cooperative. Patient is in no acute distress.  Skin: Skin is warm and dry. No rash noted.   Cardiovascular: Normal heart rate noted  Respiratory: Normal respiratory effort, no problems with respiration noted  Abdomen: Soft, gravid, appropriate for gestational age. Pain/Pressure: Present     Pelvic: Vag. Bleeding: None     Cervical exam deferred        Extremities: Normal range of motion.  Edema: None  Mental Status: Normal mood and affect. Normal behavior. Normal judgment and thought content.   Urinalysis:      Assessment and Plan:  Pregnancy: G1P0 at 9519w0d  1. Supervision of high risk pregnancy, antepartum - Third trimester anticipatory guidance  2. HIV affecting pregnancy - ID follow up 06/12/17  Preterm labor symptoms and general obstetric precautions including but not limited to vaginal bleeding, contractions, leaking of fluid and fetal movement were reviewed in detail with the patient. Please refer to After Visit Summary for other counseling recommendations.  Return in  about 2 weeks (around 06/17/2017).   Donette LarryBhambri, [name], CNM

## 2017-06-15 ENCOUNTER — Ambulatory Visit (HOSPITAL_COMMUNITY)
Admission: RE | Admit: 2017-06-15 | Discharge: 2017-06-15 | Disposition: A | Discharge status: Home / Self Care | Payer: Medicaid Other | Source: Ambulatory Visit | Attending: Advanced Practice Midwife | Admitting: Advanced Practice Midwife

## 2017-06-15 ENCOUNTER — Encounter (HOSPITAL_COMMUNITY): Payer: Self-pay

## 2017-06-15 DIAGNOSIS — Z21 Asymptomatic human immunodeficiency virus [HIV] infection status: Secondary | ICD-10-CM | POA: Diagnosis present

## 2017-06-15 DIAGNOSIS — O0990 Supervision of high risk pregnancy, unspecified, unspecified trimester: Secondary | ICD-10-CM

## 2017-06-15 DIAGNOSIS — Z3A34 34 weeks gestation of pregnancy: Secondary | ICD-10-CM | POA: Insufficient documentation

## 2017-06-15 DIAGNOSIS — O98713 Human immunodeficiency virus [HIV] disease complicating pregnancy, third trimester: Secondary | ICD-10-CM | POA: Insufficient documentation

## 2017-06-15 DIAGNOSIS — O98719 Human immunodeficiency virus [HIV] disease complicating pregnancy, unspecified trimester: Secondary | ICD-10-CM

## 2017-06-22 ENCOUNTER — Encounter: Payer: Self-pay | Admitting: Obstetrics & Gynecology

## 2017-06-30 ENCOUNTER — Ambulatory Visit (INDEPENDENT_AMBULATORY_CARE_PROVIDER_SITE_OTHER): Payer: Medicaid Other | Admitting: Student

## 2017-06-30 ENCOUNTER — Other Ambulatory Visit (HOSPITAL_COMMUNITY)
Admission: RE | Admit: 2017-06-30 | Discharge: 2017-06-30 | Disposition: A | Discharge status: Home / Self Care | Payer: Medicaid Other | Source: Ambulatory Visit | Attending: Student | Admitting: Student

## 2017-06-30 VITALS — BP 125/82 | HR 106 | Wt 172.7 lb

## 2017-06-30 DIAGNOSIS — Z21 Asymptomatic human immunodeficiency virus [HIV] infection status: Secondary | ICD-10-CM

## 2017-06-30 DIAGNOSIS — O0993 Supervision of high risk pregnancy, unspecified, third trimester: Secondary | ICD-10-CM

## 2017-06-30 DIAGNOSIS — Z3480 Encounter for supervision of other normal pregnancy, unspecified trimester: Secondary | ICD-10-CM | POA: Insufficient documentation

## 2017-06-30 DIAGNOSIS — O0990 Supervision of high risk pregnancy, unspecified, unspecified trimester: Secondary | ICD-10-CM

## 2017-06-30 DIAGNOSIS — O98713 Human immunodeficiency virus [HIV] disease complicating pregnancy, third trimester: Secondary | ICD-10-CM

## 2017-06-30 NOTE — Patient Instructions (Signed)

## 2017-06-30 NOTE — Progress Notes (Addendum)
   PRENATAL VISIT NOTE  Subjective:  Beth George is a 21 y.o. G1P0 at 4834w6d being seen today for ongoing prenatal care.  She is currently monitored for the following issues for this high-risk pregnancy and has HIV disease affecting pregnancy; Supervision of high risk pregnancy, antepartum; and Anemia in pregnancy on her problem list.  Patient reports no complaints.  Contractions: Irregular. Vag. Bleeding: None.  Movement: Present. Denies leaking of fluid. She is continuing to take her ART as prescribed and is [facility].   The following portions of the patient's history were reviewed and updated as appropriate: allergies, current medications, past family history, past medical history, past social history, past surgical history and problem list. Problem list updated.  Objective:   Vitals:   06/30/17 1027 06/30/17 1030  BP: 125/82   Pulse: 97 (!) 106  Weight: 172 lb 11.2 oz (78.3 kg)     Fetal Status: Fetal Heart Rate (bpm): 130 Fundal Height: 36 cm Movement: Present  Presentation: Vertex  General:  Alert, oriented and cooperative. Patient is in no acute distress.  Skin: Skin is warm and dry. No rash noted.   Cardiovascular: Normal heart rate noted  Respiratory: Normal respiratory effort, no problems with respiration noted  Abdomen: Soft, gravid, appropriate for gestational age.  Pain/Pressure: Present     Pelvic: Cervical exam performed        Extremities: Normal range of motion.  Edema: Mild pitting, slight indentation  Mental Status:  Normal mood and affect. Normal behavior. Normal judgment and thought content.   Assessment and Plan:  Pregnancy: G1P0 at 2634w6d  1. Supervision of high pregnancy, antepartum - Culture, beta strep (group b only) - GC/Chlamydia probe amp ([place])not at [facility]  2. HIV disease affecting pregnancy in third trimester Pt missed last [facility]12-2017 [facility] for monitoring of viral loads. Has appointment  this week and will fax over lab results. ([facility] is Chrissy R). Patient was counseled on the importance of having low viral loads on records for postpartum breast feeding. Patient verbalized that she  is very committed to staying on her medication and keeping [facility] app[facility]. I emphasized the need for her to have documentation of her viral loads in her chart to guide her IP and PP care.  -Latest viral load was undetectable per patient -Continues to take medications as prescribed   Preterm labor symptoms and general obstetric precautions including but not limited to vaginal bleeding, contractions, leaking of fluid and fetal movement were reviewed in detail with the patient. Please refer to After Visit Summary for other counseling recommendations.  Return in about 1 week (around 07/07/2017).   Marylene LandKathryn Lorraine Kooistra, CNM   I confirm that I have verified the information documented in the student's note and that I have also personally reperformed the physical exam and all medical decision making activities. Luna KitchensKathryn Kooistra CNM

## 2017-07-01 ENCOUNTER — Telehealth: Payer: Self-pay

## 2017-07-01 LAB — GC/CHLAMYDIA PROBE AMP (~~LOC~~) NOT AT ARMC
CHLAMYDIA, DNA PROBE: NEGATIVE
Neisseria Gonorrhea: NEGATIVE

## 2017-07-01 NOTE — Telephone Encounter (Signed)
Called the office stating that she had a US on 8/14 and was told that she measured 34 weeks but will be 37 weeks on 8/15. States that she is concerned that the measurements were of and [place]. Please return call.

## 2017-07-03 ENCOUNTER — Encounter: Payer: Self-pay | Admitting: Infectious Diseases

## 2017-07-04 LAB — CULTURE, BETA STREP (GROUP B ONLY): STREP GP B CULTURE: NEGATIVE

## 2017-07-12 ENCOUNTER — Encounter (HOSPITAL_COMMUNITY): Payer: Self-pay | Admitting: *Deleted

## 2017-07-12 ENCOUNTER — Inpatient Hospital Stay (HOSPITAL_COMMUNITY): Payer: Medicaid Other | Admitting: Anesthesiology

## 2017-07-12 ENCOUNTER — Inpatient Hospital Stay (HOSPITAL_COMMUNITY)
Admission: AD | Admit: 2017-07-12 | Discharge: 2017-07-14 | DRG: 774 | Disposition: A | Discharge status: Home / Self Care | Payer: Medicaid Other | Source: Ambulatory Visit | Attending: Obstetrics and Gynecology | Admitting: Obstetrics and Gynecology

## 2017-07-12 DIAGNOSIS — Z21 Asymptomatic human immunodeficiency virus [HIV] infection status: Secondary | ICD-10-CM | POA: Diagnosis present

## 2017-07-12 DIAGNOSIS — O99214 Obesity complicating childbirth: Secondary | ICD-10-CM | POA: Diagnosis present

## 2017-07-12 DIAGNOSIS — E669 Obesity, unspecified: Secondary | ICD-10-CM | POA: Diagnosis present

## 2017-07-12 DIAGNOSIS — Z3493 Encounter for supervision of normal pregnancy, unspecified, third trimester: Secondary | ICD-10-CM | POA: Diagnosis present

## 2017-07-12 DIAGNOSIS — Z6831 Body mass index (BMI) 31.0-31.9, adult: Secondary | ICD-10-CM

## 2017-07-12 DIAGNOSIS — O9902 Anemia complicating childbirth: Secondary | ICD-10-CM | POA: Diagnosis present

## 2017-07-12 DIAGNOSIS — Z3A38 38 weeks gestation of pregnancy: Secondary | ICD-10-CM

## 2017-07-12 DIAGNOSIS — D649 Anemia, unspecified: Secondary | ICD-10-CM | POA: Diagnosis present

## 2017-07-12 DIAGNOSIS — O9872 Human immunodeficiency virus [HIV] disease complicating childbirth: Principal | ICD-10-CM | POA: Diagnosis present

## 2017-07-12 LAB — CBC
HEMATOCRIT: 27.3 % — AB (ref 36.0–46.0)
HEMOGLOBIN: 9.1 g/dL — AB (ref 12.0–15.0)
MCH: 25.8 pg — ABNORMAL LOW (ref 26.0–34.0)
MCHC: 33.3 g/dL (ref 30.0–36.0)
MCV: 77.3 fL — AB (ref 78.0–100.0)
Platelets: 338 10*3/uL (ref 150–400)
RBC: 3.53 MIL/uL — AB (ref 3.87–5.11)
RDW: 15.6 % — ABNORMAL HIGH (ref 11.5–15.5)
WBC: 10.2 10*3/uL (ref 4.0–10.5)

## 2017-07-12 LAB — TYPE AND SCREEN
ABO/RH(D): A POS
Antibody Screen: NEGATIVE

## 2017-07-12 MED ORDER — OXYTOCIN BOLUS FROM INFUSION
500.0000 mL | Freq: Once | INTRAVENOUS | Status: AC
  Administered 2017-07-13: 500 mL via INTRAVENOUS

## 2017-07-12 MED ORDER — LIDOCAINE HCL (PF) 1 % IJ SOLN
INTRAMUSCULAR | Status: DC | PRN
  Administered 2017-07-12: 2 mL via EPIDURAL
  Administered 2017-07-12: 5 mL via EPIDURAL
  Administered 2017-07-12: 3 mL via EPIDURAL

## 2017-07-12 MED ORDER — FENTANYL 2.5 MCG/ML BUPIVACAINE 1/10 % EPIDURAL INFUSION (WH - ANES)
14.0000 mL/h | INTRAMUSCULAR | Status: DC | PRN
Start: 2017-07-12 — End: 2017-07-13
  Administered 2017-07-12: 14 mL/h via EPIDURAL

## 2017-07-12 MED ORDER — ZIDOVUDINE 10 MG/ML IV SOLN
1.0000 mg/kg/h | INTRAVENOUS | Status: DC
  Administered 2017-07-12: 1 mg/kg/h via INTRAVENOUS
  Filled 2017-07-12 (×2): qty 40

## 2017-07-12 MED ORDER — OXYCODONE-ACETAMINOPHEN 5-325 MG PO TABS: 2.0000 | ORAL_TABLET | ORAL | Status: DC | PRN

## 2017-07-12 MED ORDER — PHENYLEPHRINE 40 MCG/ML (10ML) SYRINGE FOR IV PUSH (FOR BLOOD PRESSURE SUPPORT)
PREFILLED_SYRINGE | INTRAVENOUS | Status: AC
  Filled 2017-07-12: qty 20

## 2017-07-12 MED ORDER — DIPHENHYDRAMINE HCL 50 MG/ML IJ SOLN
12.5000 mg | INTRAMUSCULAR | Status: DC | PRN
  Administered 2017-07-12 – 2017-07-13 (×2): 12.5 mg via INTRAVENOUS
  Filled 2017-07-12 (×2): qty 1

## 2017-07-12 MED ORDER — OXYTOCIN 40 UNITS IN LACTATED RINGERS INFUSION - SIMPLE MED
2.5000 [IU]/h | INTRAVENOUS | Status: DC
  Administered 2017-07-13: 2.5 [IU]/h via INTRAVENOUS
  Filled 2017-07-12: qty 1000

## 2017-07-12 MED ORDER — OXYCODONE-ACETAMINOPHEN 5-325 MG PO TABS: 1.0000 | ORAL_TABLET | ORAL | Status: DC | PRN

## 2017-07-12 MED ORDER — LACTATED RINGERS IV SOLN: 500.0000 mL | INTRAVENOUS | Status: DC | PRN

## 2017-07-12 MED ORDER — ONDANSETRON HCL 4 MG/2ML IJ SOLN: 4.0000 mg | Freq: Four times a day (QID) | INTRAMUSCULAR | Status: DC | PRN

## 2017-07-12 MED ORDER — PHENYLEPHRINE 40 MCG/ML (10ML) SYRINGE FOR IV PUSH (FOR BLOOD PRESSURE SUPPORT)
80.0000 ug | PREFILLED_SYRINGE | INTRAVENOUS | Status: DC | PRN
  Filled 2017-07-12: qty 5

## 2017-07-12 MED ORDER — FENTANYL 2.5 MCG/ML BUPIVACAINE 1/10 % EPIDURAL INFUSION (WH - ANES)
INTRAMUSCULAR | Status: AC
  Filled 2017-07-12: qty 100

## 2017-07-12 MED ORDER — SOD CITRATE-CITRIC ACID 500-334 MG/5ML PO SOLN: 30.0000 mL | ORAL | Status: DC | PRN

## 2017-07-12 MED ORDER — LIDOCAINE HCL (PF) 1 % IJ SOLN
30.0000 mL | INTRAMUSCULAR | Status: DC | PRN
  Filled 2017-07-12: qty 30

## 2017-07-12 MED ORDER — LACTATED RINGERS IV SOLN
INTRAVENOUS | Status: DC
  Administered 2017-07-12: 21:00:00 via INTRAVENOUS

## 2017-07-12 MED ORDER — ACETAMINOPHEN 325 MG PO TABS: 650.0000 mg | ORAL_TABLET | ORAL | Status: DC | PRN

## 2017-07-12 MED ORDER — ZIDOVUDINE 10 MG/ML IV SOLN
2.0000 mg/kg | Freq: Once | INTRAVENOUS | Status: AC
  Administered 2017-07-12: 157 mg via INTRAVENOUS
  Filled 2017-07-12: qty 15.7

## 2017-07-12 MED ORDER — FENTANYL CITRATE (PF) 100 MCG/2ML IJ SOLN: 50.0000 ug | INTRAMUSCULAR | Status: DC | PRN

## 2017-07-12 MED ORDER — EPHEDRINE 5 MG/ML INJ
10.0000 mg | INTRAVENOUS | Status: DC | PRN
  Filled 2017-07-12: qty 2

## 2017-07-12 MED ORDER — LACTATED RINGERS IV SOLN: 500.0000 mL | Freq: Once | INTRAVENOUS | Status: DC

## 2017-07-12 MED ORDER — ZIDOVUDINE 10 MG/ML IV SOLN
1.0000 mg/kg/h | INTRAVENOUS | Status: DC
  Filled 2017-07-12: qty 40

## 2017-07-12 MED ORDER — FLEET ENEMA 7-19 GM/118ML RE ENEM: 1.0000 | ENEMA | RECTAL | Status: DC | PRN

## 2017-07-12 NOTE — H&P (Signed)
LABOR AND DELIVERY ADMISSION HISTORY AND PHYSICAL NOTE  Beth George is a 21 y.o. female G1P0 with IUP at [redacted]w[redacted]d by early [place] presenting for SOL.  She was at [facility] with contractions, was 3 cm in their ED and transferred here as she follows at our clinic. She sees ID at [facility] for HIV+ status. Reports her viral loads have been undetectable consistently.   She reports positive fetal movement. She denies leakage of fluid or vaginal bleeding. She is uncomfortable with contractions and desires epidural. Having a boy, breast&bottle feeding, unsure about contraception.  Prenatal History/Complications: Prenatal care at [facility] with first prenatal visit at 21w Anemia in pregnancy HIV positive  Past Medical History: Past Medical History:  Diagnosis Date  . HIV disease affecting pregnancy 03/05/2017   CD4 586 On medication per med list Followed at [facility] to [facility].     Past Surgical History: History reviewed. No pertinent surgical history.  Obstetrical History: OB History    Gravida Para Term Preterm AB Living   1         0   SAB TAB Ectopic Multiple Live Births                  Social History: Social History   Social History  . Marital status: Single    Spouse name: N/A  . Number of children: N/A  . Years of education: N/A   Social History Main Topics  . Smoking status: Never Smoker  . Smokeless tobacco: Never Used  . Alcohol use No  . Drug use: No  . Sexual activity: Not Currently    Birth control/ protection: None   Other Topics Concern  . None   Social History Narrative  . None    Family History: Family History  Problem Relation Age of Onset  . Cancer Father     Allergies: No Known Allergies  Prescriptions Prior to Admission  Medication Sig Dispense Refill Last Dose  . dolutegravir (TIVICAY) 50 MG tablet Take 50 mg by mouth daily.   07/11/2017 at Unknown time  . emtricitabine-tenofovir AF (DESCOVY) 200-25 MG tablet Take  1 tablet by mouth daily.   07/11/2017 at Unknown time  . Prenatal Vit-Fe Fumarate-FA (PRENATAL MULTIVITAMIN) TABS tablet Take 1 tablet by mouth daily at 12 noon.   Past Week at Unknown time  . promethazine (PHENERGAN) 12.5 MG tablet Take 2 tablets (25 mg total) by mouth every 6 (six) hours as needed for nausea or vomiting. (Patient not taking: Reported on 06/30/2017) 30 tablet 1 Not Taking     Review of Systems   All systems reviewed and negative except as stated in HPI  Blood pressure 126/78, pulse (!) 108, temperature 98.2 F (36.8 C), temperature source Oral, resp. rate 18, last menstrual period 10/15/2016. General appearance: alert, cooperative and mild distress Lungs: no respiratory distress Heart: regular rate Abdomen: soft, non-tender Extremities: No calf swelling or tenderness Presentation: cephalic by nurse exam Fetal monitoring: baseline 135, moderate variability, accels present, no decels Uterine activity: q3-4 minutes Dilation: 5.5 Effacement (%): 90 Station: 0 Exam by:: Charna Archer, rn   Prenatal labs: ABO, Rh: A/Positive/-- (03/05 0000) Antibody: Negative (03/05 0000) Rubella: immune RPR: Non Reactive (06/18 1004)  HBsAg: Negative (03/05 0000)  HIV: Reactive (03/05 0000)  GBS:   neg Genetic screening:  Too late Anatomy US: normal  Prenatal Transfer Tool  Maternal Diabetes: No Genetic Screening: too late Maternal Ultrasounds/Referrals: Normal Fetal Ultrasounds or other Referrals:  Referred to  Materal Fetal Medicine  Maternal Substance Abuse:  No Significant Maternal Medications:  Meds include: Other: Tivicay, Descovy Significant Maternal Lab Results: Lab values include: HIV positive  No results found for this or any previous visit (from the past 24 hour(s)).  Patient Active Problem List   Diagnosis Date Noted  . Normal labor 07/12/2017  . Anemia in pregnancy 05/19/2017  . HIV disease affecting pregnancy 03/05/2017  . Supervision of high risk pregnancy,  antepartum 03/05/2017    Assessment: Beth George is a 21 y.o. G1P0 at [redacted]w[redacted]d here for SOL, HIV+   #Labor: SOL, augment as needed per protocol #Pain: Desires epidural #FWB:  cat 1 tracing #ID:  GBS neg #MOF: both #MOC: unsure #Circ:  Yes, outpatient   HIV + status: Records requested from [place] [facility] to confirm viral load, will start AZT infusion during labor.   Tillman Sers, DO PGY-2 8/26/20189:12 PM  OB FELLOW HISTORY AND PHYSICAL ATTESTATION  I confirm that I have verified the information documented in the resident's note and that I have also personally reperformed the physical exam and all medical decision making activities. I agree with above documentation and have made edits as needed.   Caryl Ada OB Fellow 07/12/2017, 9:28 PM

## 2017-07-12 NOTE — Progress Notes (Addendum)
Assumed care after RN Fields completed triage assessment. Pt is G1 @ 38+[redacted] wksga. Presents to triage for r/o labor. Denies LOF or bleeding. + FM.  Sent from [place] since pt is seen here.   Hx: HIV-1  EFM applied.  SVE: 5-6  2043: Provider notified. Report status of pt given. Orders received to admit.   2044: Birthing charge nurse notified. Report given. Room assigned to 162 2050: Pt to birthing via wheelchair.

## 2017-07-12 NOTE — Anesthesia Preprocedure Evaluation (Signed)
Anesthesia Evaluation  Patient identified by MRN, date of birth, ID band Patient awake    Reviewed: Allergy & Precautions, NPO status , Patient's Chart, lab work & pertinent test results  Airway Mallampati: II  TM Distance: >3 FB Neck ROM: Full    Dental  (+) Teeth Intact, Dental Advisory Given   Pulmonary neg pulmonary ROS,    Pulmonary exam normal breath sounds clear to auscultation       Cardiovascular negative cardio ROS Normal cardiovascular exam Rhythm:Regular Rate:Normal     Neuro/Psych negative neurological ROS     GI/Hepatic negative GI ROS, Neg liver ROS,   Endo/Other  Obesity   Renal/GU negative Renal ROS     Musculoskeletal negative musculoskeletal ROS (+)   Abdominal   Peds  Hematology  (+) Blood dyscrasia, anemia , HIV, Plt 338k   Anesthesia Other Findings Day of surgery medications reviewed with the patient.  Reproductive/Obstetrics (+) Pregnancy                             Anesthesia Physical Anesthesia Plan  ASA: III  Anesthesia Plan: Epidural   Post-op Pain Management:    Induction:   PONV Risk Score and Plan: Treatment may vary due to age or medical condition  Airway Management Planned:   Additional Equipment:   Intra-op Plan:   Post-operative Plan:   Informed Consent: I have reviewed the patients History and Physical, chart, labs and discussed the procedure including the risks, benefits and alternatives for the proposed anesthesia with the patient or authorized representative who has indicated his/her understanding and acceptance.   Dental advisory given  Plan Discussed with:   Anesthesia Plan Comments: (Patient identified. Risks/Benefits/Options discussed with patient including but not limited to bleeding, infection, nerve damage, paralysis, failed block, incomplete pain control, headache, blood pressure changes, nausea, vomiting, reactions to  medication both or allergic, itching and postpartum back pain. Confirmed with bedside nurse the patient's most recent platelet count. Confirmed with patient that they are not currently taking any anticoagulation, have any bleeding history or any family history of bleeding disorders. Patient expressed understanding and wished to proceed. All questions were answered. )        Anesthesia Quick Evaluation

## 2017-07-12 NOTE — Anesthesia Procedure Notes (Signed)
Epidural Patient location during procedure: OB Start time: 07/12/2017 9:50 PM End time: 07/12/2017 9:55 PM  Staffing Anesthesiologist: Cecile Hearing Performed: anesthesiologist   Preanesthetic Checklist Completed: patient identified, pre-op evaluation, timeout performed, IV checked, risks and benefits discussed and monitors and equipment checked  Epidural Patient position: sitting Prep: DuraPrep Patient monitoring: blood pressure and continuous pulse ox Approach: midline Location: L3-L4 Injection technique: LOR air  Needle:  Needle type: Tuohy  Needle gauge: 17 G Needle length: 9 cm Needle insertion depth: 5 cm Catheter size: 19 Gauge Catheter at skin depth: 10 cm Test dose: negative and Other (1% Lidocaine)  Additional Notes Patient identified.  Risk benefits discussed including failed block, incomplete pain control, headache, nerve damage, paralysis, blood pressure changes, nausea, vomiting, reactions to medication both toxic or allergic, and postpartum back pain.  Patient expressed understanding and wished to proceed.  All questions were answered.  Sterile technique used throughout procedure and epidural site dressed with sterile barrier dressing. No paresthesia or other complications noted. The patient did not experience any signs of intravascular injection such as tinnitus or metallic taste in mouth nor signs of intrathecal spread such as rapid motor block. Please see nursing notes for vital signs. Reason for block:procedure for pain

## 2017-07-12 NOTE — MAU Note (Signed)
Pt presents to MAU c/o ctx that started around 1700 that were around apart pt went to the [place] [facility] where she was seen and checked. Pt stated that she was dilated to a 1cm and 1hour later was 3 cm they informed her since she received care here she needed to come here for her delivery. Pt states the ctx are now every 6 min apart and she is rating them at a 6 on the pain scale. Pt states she is having bloody show. Good FM

## 2017-07-13 ENCOUNTER — Encounter (HOSPITAL_COMMUNITY): Payer: Self-pay | Admitting: Obstetrics and Gynecology

## 2017-07-13 DIAGNOSIS — Z21 Asymptomatic human immunodeficiency virus [HIV] infection status: Secondary | ICD-10-CM

## 2017-07-13 DIAGNOSIS — Z3A38 38 weeks gestation of pregnancy: Secondary | ICD-10-CM

## 2017-07-13 DIAGNOSIS — O9872 Human immunodeficiency virus [HIV] disease complicating childbirth: Secondary | ICD-10-CM

## 2017-07-13 LAB — RPR: RPR: NONREACTIVE

## 2017-07-13 LAB — ABO/RH: ABO/RH(D): A POS

## 2017-07-13 MED ORDER — COCONUT OIL OIL: 1.0000 "application " | TOPICAL_OIL | Status: DC | PRN

## 2017-07-13 MED ORDER — DIPHENHYDRAMINE HCL 25 MG PO CAPS
25.0000 mg | ORAL_CAPSULE | Freq: Four times a day (QID) | ORAL | Status: DC | PRN
  Administered 2017-07-13 – 2017-07-14 (×2): 25 mg via ORAL
  Filled 2017-07-13 (×2): qty 1

## 2017-07-13 MED ORDER — ONDANSETRON HCL 4 MG PO TABS: 4.0000 mg | ORAL_TABLET | ORAL | Status: DC | PRN

## 2017-07-13 MED ORDER — PRENATAL MULTIVITAMIN CH
1.0000 | ORAL_TABLET | Freq: Every day | ORAL | Status: DC
  Administered 2017-07-13 – 2017-07-14 (×2): 1 via ORAL
  Filled 2017-07-13 (×2): qty 1

## 2017-07-13 MED ORDER — ONDANSETRON HCL 4 MG/2ML IJ SOLN: 4.0000 mg | INTRAMUSCULAR | Status: DC | PRN

## 2017-07-13 MED ORDER — SENNOSIDES-DOCUSATE SODIUM 8.6-50 MG PO TABS
2.0000 | ORAL_TABLET | ORAL | Status: DC
  Administered 2017-07-13: 2 via ORAL
  Filled 2017-07-13: qty 2

## 2017-07-13 MED ORDER — ACETAMINOPHEN 325 MG PO TABS: 650.0000 mg | ORAL_TABLET | ORAL | Status: DC | PRN

## 2017-07-13 MED ORDER — TETANUS-DIPHTH-ACELL PERTUSSIS 5-2.5-18.5 LF-MCG/0.5 IM SUSP: 0.5000 mL | Freq: Once | INTRAMUSCULAR | Status: DC

## 2017-07-13 MED ORDER — ZOLPIDEM TARTRATE 5 MG PO TABS: 5.0000 mg | ORAL_TABLET | Freq: Every evening | ORAL | Status: DC | PRN

## 2017-07-13 MED ORDER — WITCH HAZEL-GLYCERIN EX PADS: 1.0000 "application " | MEDICATED_PAD | CUTANEOUS | Status: DC | PRN

## 2017-07-13 MED ORDER — IBUPROFEN 600 MG PO TABS
600.0000 mg | ORAL_TABLET | Freq: Four times a day (QID) | ORAL | Status: DC
  Administered 2017-07-13 – 2017-07-14 (×5): 600 mg via ORAL
  Filled 2017-07-13 (×5): qty 1

## 2017-07-13 MED ORDER — DOLUTEGRAVIR SODIUM 50 MG PO TABS
50.0000 mg | ORAL_TABLET | Freq: Every day | ORAL | Status: DC
  Administered 2017-07-13 – 2017-07-14 (×2): 50 mg via ORAL
  Filled 2017-07-13 (×2): qty 1

## 2017-07-13 MED ORDER — SIMETHICONE 80 MG PO CHEW: 80.0000 mg | CHEWABLE_TABLET | ORAL | Status: DC | PRN

## 2017-07-13 MED ORDER — EMTRICITABINE-TENOFOVIR AF 200-25 MG PO TABS
1.0000 | ORAL_TABLET | Freq: Every day | ORAL | Status: DC
  Administered 2017-07-13 – 2017-07-14 (×2): 1 via ORAL
  Filled 2017-07-13 (×2): qty 1

## 2017-07-13 MED ORDER — DIBUCAINE 1 % RE OINT: 1.0000 "application " | TOPICAL_OINTMENT | RECTAL | Status: DC | PRN

## 2017-07-13 MED ORDER — BENZOCAINE-MENTHOL 20-0.5 % EX AERO
1.0000 "application " | INHALATION_SPRAY | CUTANEOUS | Status: DC | PRN
  Administered 2017-07-13: 1 via TOPICAL
  Filled 2017-07-13: qty 56

## 2017-07-13 NOTE — Progress Notes (Signed)
UR chart review completed.  

## 2017-07-13 NOTE — Progress Notes (Signed)
  LABOR PROGRESS NOTE  Beth George is a 21 y.o. G1P0 at [redacted]w[redacted]d  admitted for SOL  Subjective: Comfortable with epidural  Objective: BP (!) 95/50   Pulse (!) 103   Temp 97.7 F (36.5 C) (Axillary)   Resp 16   Ht 5\' 2"  (1.575 m)   Wt 78 kg (172 lb)   LMP 10/15/2016   SpO2 100%   BMI 31.46 kg/m  or  Vitals:   07/12/17 2220 07/12/17 2222 07/12/17 2334 07/13/17 0000  BP:  113/67 (!) 98/57 (!) 95/50  Pulse:  (!) 103 (!) 105 (!) 103  Resp:  18 16 16   Temp:   97.7 F (36.5 C)   TempSrc:   Axillary   SpO2: 100%     Weight:      Height:         Dilation: 6 Effacement (%): 80 Cervical Position: Anterior Station: -2 Presentation: Vertex Exam by:: [employer] RN FHT: baseline 120, moderate variability, acceclerations present, no decels Uterine activity: q4 minutes  Labs: Lab Results  Component Value Date   WBC 10.2 07/12/2017   HGB 9.1 (L) 07/12/2017   HCT 27.3 (L) 07/12/2017   MCV 77.3 (L) 07/12/2017   PLT 338 07/12/2017    Patient Active Problem List   Diagnosis Date Noted  . Normal labor 07/12/2017  . Anemia in pregnancy 05/19/2017  . HIV disease affecting pregnancy 03/05/2017  . Supervision of high risk pregnancy, antepartum 03/05/2017    Assessment / Plan: 21 y.o. G1P0 at [redacted]w[redacted]d here for SOL  Labor:  Progressing naturally Fetal Wellbeing:  Cat 1 tracing Pain Control:  Epidural in place Anticipated MOD:  vaginal  Tillman Sers, DO PGY-2 8/27/201812:17 AM

## 2017-07-13 NOTE — Anesthesia Postprocedure Evaluation (Signed)
Anesthesia Post Note  Patient: Beth George  Procedure(s) Performed: * No procedures listed *     Patient location during evaluation: Mother Baby Anesthesia Type: Epidural Level of consciousness: awake and alert and oriented Pain management: satisfactory to patient Vital Signs Assessment: post-procedure vital signs reviewed and stable Respiratory status: spontaneous breathing and nonlabored ventilation Cardiovascular status: stable Postop Assessment: no headache, no backache, no signs of nausea or vomiting, adequate PO intake and patient able to bend at knees (patient up walking) Anesthetic complications: no    Last Vitals:  Vitals:   07/13/17 0404 07/13/17 0559  BP: 132/75 130/71  Pulse: 93 89  Resp: 16 16  Temp: 36.9 C 36.9 C  SpO2:      Last Pain:  Vitals:   07/13/17 0730  TempSrc:   PainSc: 0-No pain   Pain Goal:                 Beth George

## 2017-07-14 ENCOUNTER — Encounter: Payer: Self-pay | Admitting: Certified Nurse Midwife

## 2017-07-14 MED ORDER — IBUPROFEN 600 MG PO TABS: 600.0000 mg | ORAL_TABLET | Freq: Four times a day (QID) | ORAL | 0 refills | Status: DC

## 2017-07-14 NOTE — Discharge Summary (Signed)
OB Discharge Summary  Patient Name: Beth George DOB: August 26, 1996 MRN: [id number]  Date of admission: 07/12/2017 Delivering MD: Dolores Patty C   Date of discharge: 07/14/2017  Admitting diagnosis: 38.4 WKS, CTXS Intrauterine pregnancy: [redacted]w[redacted]d     Secondary diagnosis:Active Problems:   Normal labor  Additional problems: + HIV, obesity     Discharge diagnosis: Term Pregnancy Delivered                                                                     Complications: None  Hospital course:  Onset of Labor With Vaginal Delivery     21 y.o. yo G1P1001 at [redacted]w[redacted]d was admitted in Active Labor on 07/12/2017. Patient had an uncomplicated labor course as follows:  Membrane Rupture Time/Date: 1:45 AM ,07/13/2017   Intrapartum Procedures: Episiotomy: None [1]                                         Lacerations:  Sulcus [9]  Patient had a delivery of a Viable infant. 07/13/2017  Information for the patient's newborn:  Felichia, [place] [[id number]]  Delivery Method: Vaginal, Spontaneous Delivery (Filed from Delivery Summary)    Pateint had an uncomplicated postpartum course.  She is ambulating, tolerating a regular diet, passing flatus, and urinating well. Patient is discharged home in stable condition on 07/14/17.   Physical exam  Vitals:   07/13/17 0559 07/13/17 1145 07/13/17 1823 07/14/17 0552  BP: 130/71 127/74 136/81 138/78  Pulse: 89 90 95 (!) 101  Resp: 16 18 17 18   Temp: 98.4 F (36.9 C) 98.3 F (36.8 C) 98 F (36.7 C) 98.6 F (37 C)  TempSrc: Axillary Axillary Oral Oral  SpO2:  98% 100%   Weight:      Height:       General: alert Lochia: appropriate Uterine Fundus: firm and NT at U-2 Incision: N/A DVT Evaluation: No evidence of DVT seen on physical exam. Labs: Lab Results  Component Value Date   WBC 10.2 07/12/2017   HGB 9.1 (L) 07/12/2017   HCT 27.3 (L) 07/12/2017   MCV 77.3 (L) 07/12/2017   PLT 338 07/12/2017   No flowsheet data found.  Discharge  instruction: per After Visit Summary and "Baby and Me Booklet".  After Visit Meds:  Allergies as of 07/14/2017   No Known Allergies     Medication List    TAKE these medications   dolutegravir 50 MG tablet Commonly known as:  TIVICAY Take 50 mg by mouth daily.   emtricitabine-tenofovir AF 200-25 MG tablet Commonly known as:  DESCOVY Take 1 tablet by mouth daily.   ibuprofen 600 MG tablet Commonly known as:  ADVIL,MOTRIN Take 1 tablet (600 mg total) by mouth every 6 (six) hours.   prenatal multivitamin Tabs tablet Take 1 tablet by mouth daily at 12 noon.            Discharge Care Instructions        Start     Ordered   07/14/17 0000  ibuprofen (ADVIL,MOTRIN) 600 MG tablet  Every 6 hours     07/14/17 0604  Diet: routine diet  Activity: Advance as tolerated. Pelvic rest for 6 weeks.   Outpatient follow up:4 weeeks Follow up Appt:Future Appointments Date Time Provider Department Center  08/24/2017 8:20 AM Armando Reichert, CNM [facility]   Follow up visit: No Follow-up on file.  Postpartum contraception: Undecided  Newborn Data: Live born female  Birth Weight: 7 lb 0.2 oz (3181 g) APGAR: 9, 9  Baby Feeding: Bottle Disposition:home with mother   07/14/2017 Allie Bossier, MD

## 2017-07-14 NOTE — Discharge Instructions (Signed)
Contraception Choices °Contraception, also called birth control, means things to use or ways to try not to get pregnant. °Hormonal birth control °This kind of birth control uses hormones. Here are some types of hormonal birth control: °· A tube that is put under skin of the arm (implant). The tube can stay in for as long as 3 years. °· Shots to get every 3 months (injections). °· Pills to take every day (birth control pills). °· A patch to change 1 time each week for 3 weeks (birth control patch). After that, the patch is taken off for 1 week. °· A ring to put in the vagina. The ring is left in for 3 weeks. Then it is taken out of the vagina for 1 week. Then a new ring is put in. °· Pills to take after unprotected sex (emergency birth control pills). ° °Barrier birth control °Here are some types of barrier birth control: °· A thin covering that is put on the penis before sex (female condom). The covering is thrown away after sex. °· A soft, loose covering that is put in the vagina before sex (female condom). The covering is thrown away after sex. °· A rubber bowl that sits over the cervix (diaphragm). The bowl must be made for you. The bowl is put into the vagina before sex. The bowl is left in for 6-8 hours after sex. It is taken out within 24 hours. °· A small, soft cup that fits over the cervix (cervical cap). The cup must be made for you. The cup can be left in for 6-8 hours after sex. It is taken out within 48 hours. °· A sponge that is put into the vagina before sex. It must be left in for at least 6 hours after sex. It must be taken out within 30 hours. Then it is thrown away. °· A chemical that kills or stops sperm from getting into the uterus (spermicide). It may be a pill, cream, jelly, or foam to put in the vagina. The chemical should be used at least 10-15 minutes before sex. ° °IUD (intrauterine) birth control °An IUD is a small, T-shaped piece of plastic. It is put inside the uterus. There are two  kinds: °· Hormone IUD. This kind can stay in for 3-5 years. °· Copper IUD. This kind can stay in for 10 years. ° °Permanent birth control °Here are some types of permanent birth control: °· Surgery to block the fallopian tubes. °· Having an insert put into each fallopian tube. °· Surgery to tie off the tubes that carry sperm (vasectomy). ° °Natural planning birth control °Here are some types of natural planning birth control: °· Not having sex on the days the woman could get pregnant. °· Using a calendar: °? To keep track of the length of each period. °? To find out what days pregnancy can happen. °? To plan to not have sex on days when pregnancy can happen. °· Watching for symptoms of ovulation and not having sex during ovulation. One way the woman can check for ovulation is to check her temperature. °· Waiting to have sex until after ovulation. ° °Summary °· Contraception, also called birth control, means things to use or ways to try not to get pregnant. °· Hormonal methods of birth control include implants, injections, pills, patches, vaginal rings, and emergency birth control pills. °· Barrier methods of birth control can include female condoms, female condoms, diaphragms, cervical caps, sponges, and spermicides. °· There are two types of   IUD (intrauterine device) birth control. An IUD can be put in a woman's uterus to prevent pregnancy for 3-5 years. °· Permanent sterilization can be done through a procedure for males, females, or both. °· Natural planning methods involve not having sex on the days when the woman could get pregnant. °This information is not intended to replace advice given to you by your health care provider. Make sure you discuss any questions you have with your health care provider. °Document Released: 08/31/2009 Document Revised: 11/13/2016 Document Reviewed: 11/13/2016 °Elsevier Interactive Patient Education © 2017 [employer] °Home Care Instructions for Mom °ACTIVITY °· Gradually return to  your regular activities. °· Let yourself rest. Nap while your baby sleeps. °· Avoid lifting anything that is heavier than 10 lb (4.5 kg) until your health care provider says it is okay. °· Avoid activities that take a lot of effort and energy (are strenuous) until approved by your health care provider. Walking at a slow-to-moderate pace is usually safe. °· If you had a cesarean delivery: °? Do not vacuum, climb stairs, or drive a car for 4-6 weeks. °? Have someone help you at home until you feel like you can do your usual activities yourself. °? Do exercises as told by your health care provider, if this applies. ° °VAGINAL BLEEDING °You may continue to bleed for 4-6 weeks after delivery. Over time, the amount of blood usually decreases and the color of the blood usually gets lighter. However, the flow of bright red blood may increase if you have been too active. If you need to use more than one pad in an hour because your pad gets soaked, or if you pass a large clot: °· Lie down. °· Raise your feet. °· Place a cold compress on your lower abdomen. °· Rest. °· Call your health care provider. ° °If you are breastfeeding, your period should return anytime between 8 weeks after delivery and the time that you stop breastfeeding. If you are not breastfeeding, your period should return 6-8 weeks after delivery. °PERINEAL CARE °The perineal area, or perineum, is the part of your body between your thighs. After delivery, this area needs special care. Follow these instructions as told by your health care provider. °· Take warm tub baths for 15-20 minutes. °· Use medicated pads and pain-relieving sprays and creams as told. °· Do not use tampons or douches until vaginal bleeding has stopped. °· Each time you go to the bathroom: °? Use a peri bottle. °? Change your pad. °? Use towelettes in place of toilet paper until your stitches have healed. °· Do Kegel exercises every day. Kegel exercises help to maintain the muscles that  support the vagina, bladder, and bowels. You can do these exercises while you are standing, sitting, or lying down. To do Kegel exercises: °? Tighten the muscles of your abdomen and the muscles that surround your birth canal. °? Hold for a few seconds. °? Relax. °? Repeat until you have done this 5 times in a row. °· To prevent hemorrhoids from developing or getting worse: °? Drink enough fluid to keep your urine clear or pale yellow. °? Avoid straining when having a bowel movement. °? Take over-the-counter medicines and stool softeners as told by your health care provider. ° °BREAST CARE °· Wear a tight-fitting bra. °· Avoid taking over-the-counter pain medicine for breast discomfort. °· Apply ice to the breasts to help with discomfort as needed: °? Put ice in a plastic bag. °? Place a towel between your   skin and the bag. °? Leave the ice on for 20 minutes or as told by your health care provider. ° °NUTRITION °· Eat a well-balanced diet. °· Do not try to lose weight quickly by cutting back on calories. °· Take your prenatal vitamins until your postpartum checkup or until your health care provider tells you to stop. ° °POSTPARTUM DEPRESSION °You may find yourself crying for no apparent reason and unable to cope with all of the changes that come with having a newborn. This mood is called postpartum depression. Postpartum depression happens because your hormone levels change after delivery. If you have postpartum depression, get support from your partner, friends, and family. If the depression does not go away on its own after several weeks, contact your health care provider. °BREAST SELF-EXAM °Do a breast self-exam each month, at the same time of the month. If you are breastfeeding, check your breasts just after a feeding, when your breasts are less full. If you are breastfeeding and your period has started, check your breasts on day 5, 6, or 7 of your period. °Report any lumps, bumps, or discharge to your health  care provider. Know that breasts are normally lumpy if you are breastfeeding. This is temporary, and it is not a health risk. °INTIMACY AND SEXUALITY °Avoid sexual activity for at least 3-4 weeks after delivery or until the brownish-red vaginal flow is completely gone. If you want to avoid pregnancy, use some form of birth control. You can get pregnant after delivery, even if you have not had your period. °SEEK MEDICAL CARE IF: °· You feel unable to cope with the changes that a child brings to your life, and these feelings do not go away after several weeks. °· You notice a lump, a bump, or discharge on your breast. ° °SEEK IMMEDIATE MEDICAL CARE IF: °· Blood soaks your pad in 1 hour or less. °· You have: °? Severe pain or cramping in your lower abdomen. °? A bad-smelling vaginal discharge. °? A fever that is not controlled by medicine. °? A fever, and an area of your breast is red and sore. °? Pain or redness in your calf. °? Sudden, severe chest pain. °? Shortness of breath. °? Painful or bloody urination. °? Problems with your vision. °· You vomit for 12 hours or longer. °· You develop a severe headache. °· You have serious thoughts about hurting yourself, your child, or anyone else. ° °This information is not intended to replace advice given to you by your health care provider. Make sure you discuss any questions you have with your health care provider. °Document Released: 10/31/2000 Document Revised: 04/10/2016 Document Reviewed: 05/07/2015 °Elsevier Interactive Patient Education © 2017 Elsevier Inc. ° °

## 2017-07-14 NOTE — Progress Notes (Signed)
CSW provided MOB with follow-up appointment with [facility].  Infant has an appointment scheduled for 9/14 at 4:30pm.  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   CSW assisted family with WIC, Food Stamp, and Medicaid application for infant.   MOB denied all other psychosocial stressors.  There are no barriers to d/c.   Ellenie Salome Boyd-Gilyard, MSW, LCSW Clinical Social Work [phone] 

## 2017-07-15 ENCOUNTER — Telehealth: Payer: Self-pay | Admitting: General Practice

## 2017-07-15 MED ORDER — FERROUS SULFATE 324 (65 FE) MG PO TBEC: 1.0000 | DELAYED_RELEASE_TABLET | Freq: Every day | ORAL | 0 refills | Status: DC

## 2017-07-15 NOTE — Telephone Encounter (Signed)
[facility] stating the patient was in the office this morning and had her Hgb checked and it was 7.0. Spoke with Dr Shawnie PonsPratt who recommends patient begin iron daily- would need to be seen if she was symptomatic or bleeding heavily. Called patient and asked how her bleeding has been and how she has been feeling since she's been home. Patient states her bleeding is light and she has felt pretty good. Patient reports occasional headaches and sometimes seeing spots. Patient denies dizziness or shortness of breath. Recommended she take ibuprofen or tylenol for her headache. Told patient if her headache is unrelieved by medication or if she continues to see spots she should come in for evaluation. Informed patient of iron Rx sent to pharmacy & how to take. Told patient if she develops heavy bleeding or feels dizziness or short of breath, she should call and let us know. Patient verbalized understanding to all & had no questions

## 2017-07-16 ENCOUNTER — Other Ambulatory Visit: Payer: Self-pay | Admitting: Internal Medicine

## 2017-07-21 ENCOUNTER — Encounter: Payer: Self-pay | Admitting: Advanced Practice Midwife

## 2017-08-24 ENCOUNTER — Encounter: Payer: Self-pay | Admitting: Advanced Practice Midwife

## 2017-08-24 ENCOUNTER — Ambulatory Visit: Payer: Self-pay | Admitting: Advanced Practice Midwife

## 2017-09-27 IMAGING — US US MFM OB DETAIL+14 WK
1 series · 14 of 28 positions shown · non-contrast
Comparison: none

[Series 1: us mfm ob detail+14 wk · 14 of 62 slices shown]
[im 3/62]
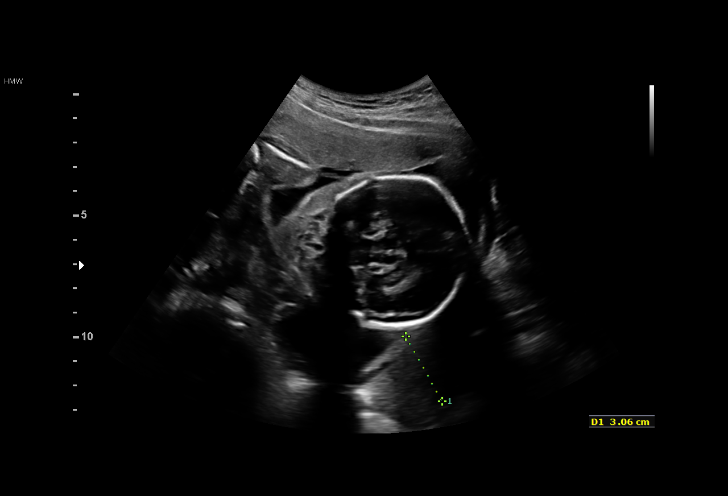
[im 7/62]
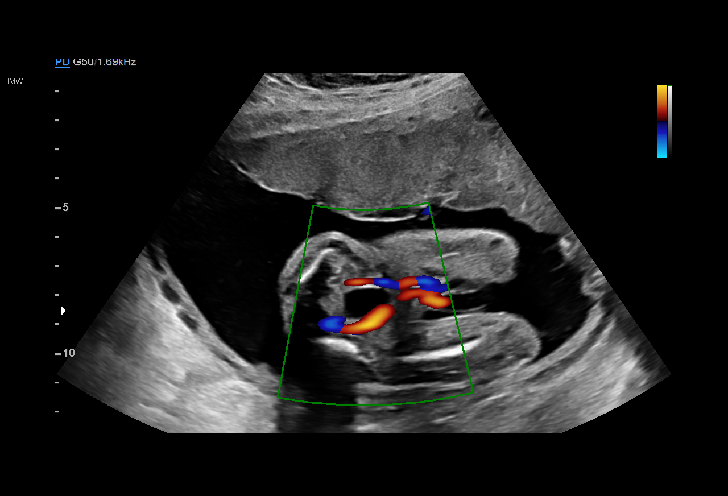
[im 12/62]
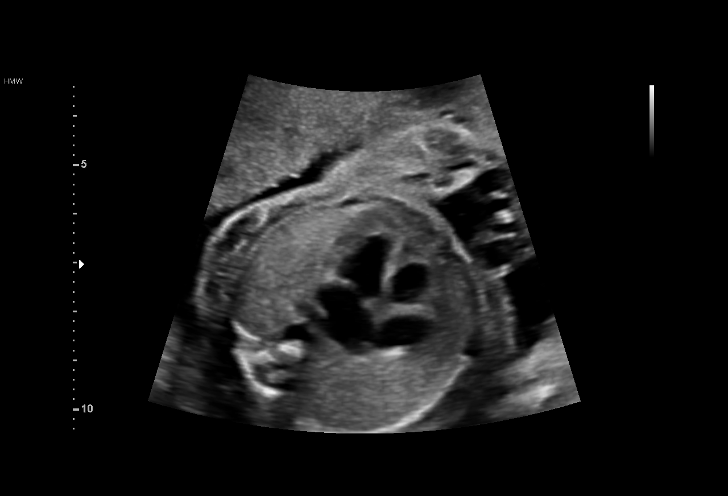
[im 16/62]
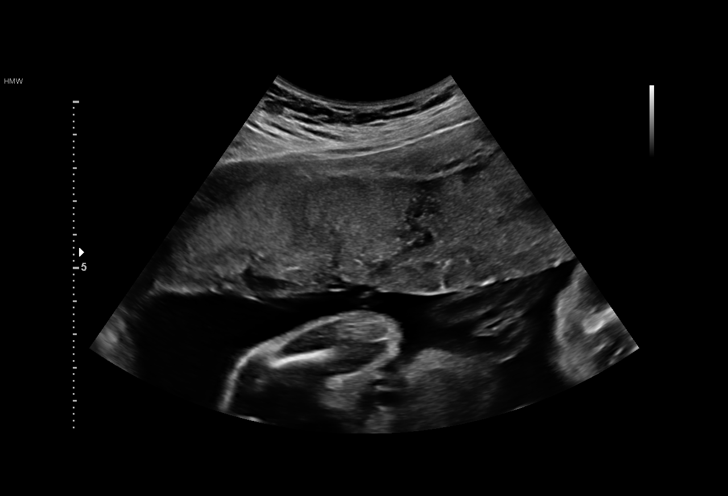
[im 21/62]
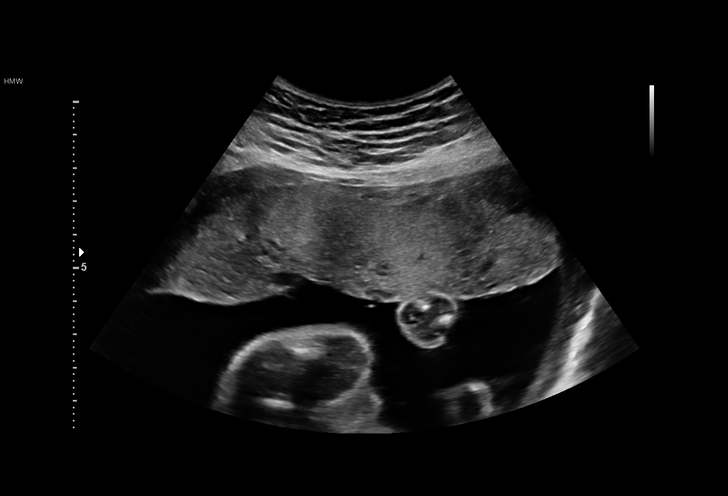
[im 25/62]
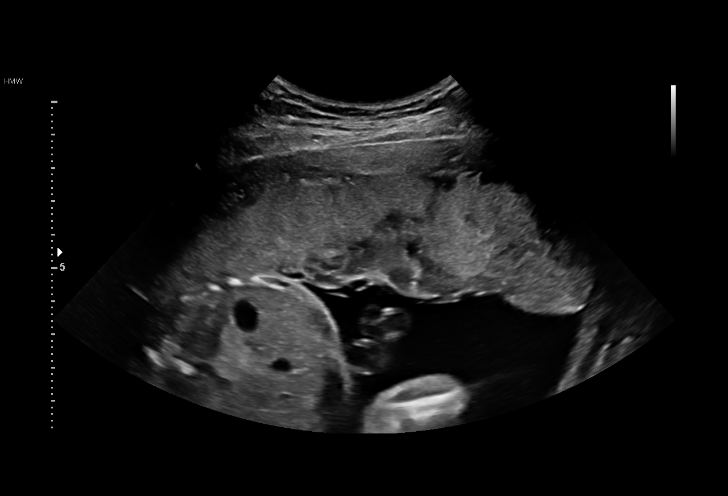
[im 30/62]
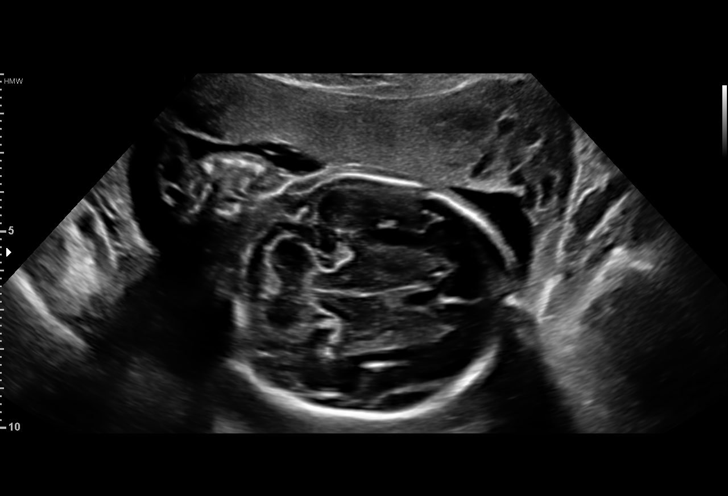
[im 34/62]
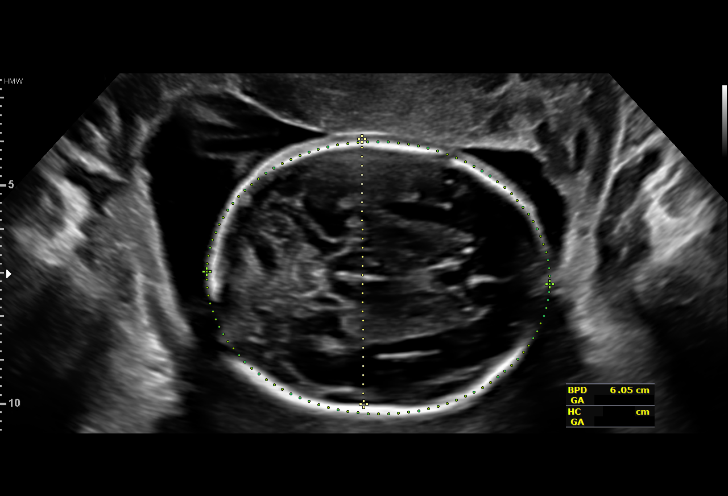
[im 39/62]
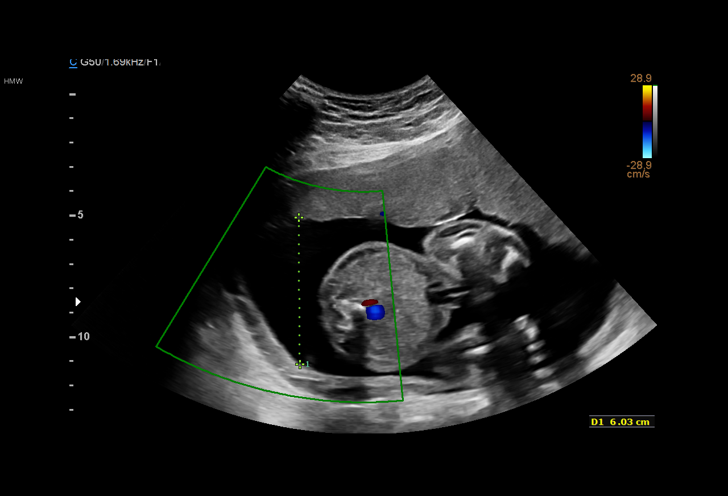
[im 43/62]
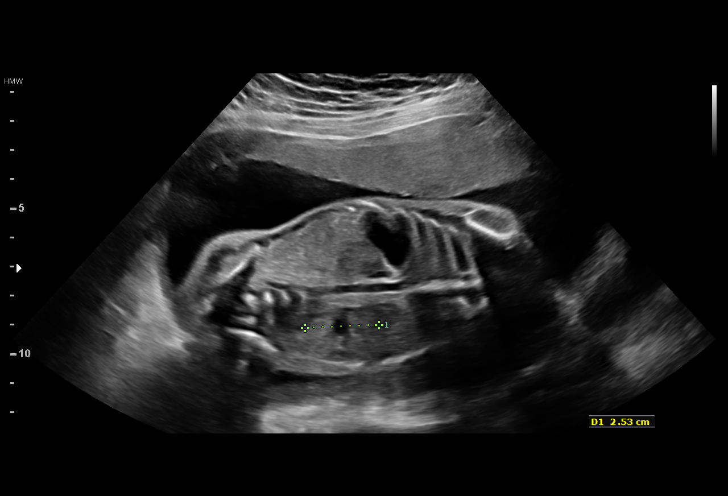
[im 48/62]
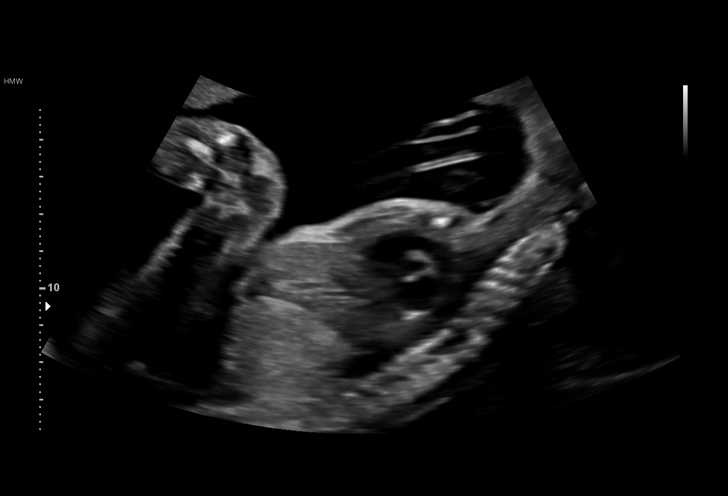
[im 52/62]
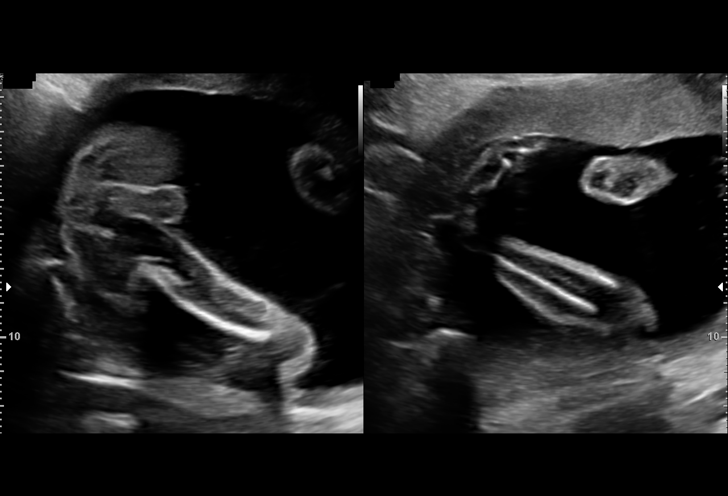
[im 57/62]
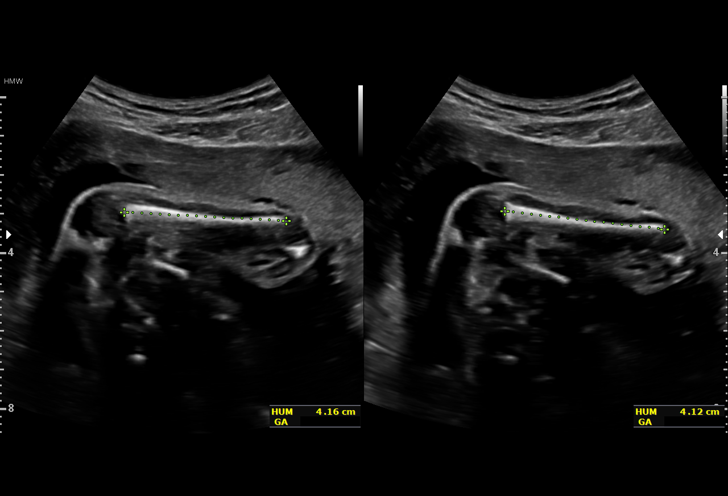
[im 62/62]
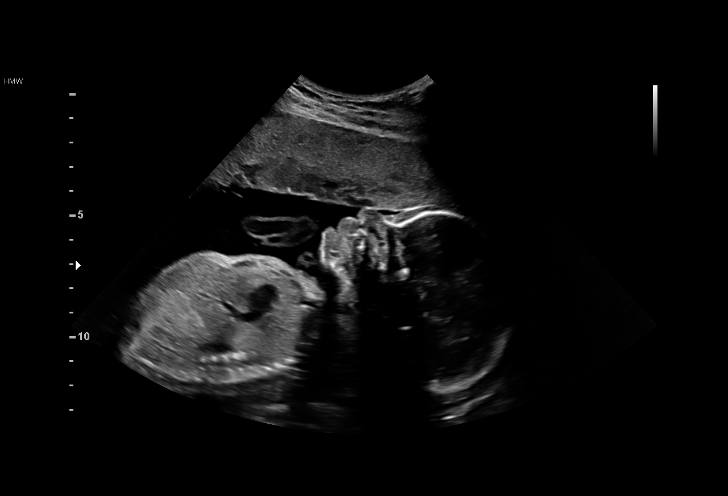

[14 of 28 positions shown; findings below may reference images not displayed]

1  UNRULY JOEL MACKIMTOCH              [id number]      [id number]     [id number]
Indications

24 weeks gestation of pregnancy
Encounter for antenatal screening for
malformations
HIV affecting pregnancy, second trimester
OB History

Gravidity:    1         Term:   0        Prem:   0        SAB:   0
TOP:          0       Ectopic:  0        Living: 0
Fetal Evaluation

Num Of Fetuses:     1
Fetal Heart         151
Rate(bpm):
Cardiac Activity:   Observed
Presentation:       Cephalic
Placenta:           Anterior, above cervical os
P. Cord Insertion:  Visualized, central

Amniotic Fluid
AFI FV:      Subjectively within normal limits

Largest Pocket(cm)
6.0
Biometry

BPD:      60.2  mm     G. Age:  24w 4d         58  %    CI:        73.62   %   70 - 86
FL/HC:      19.1   %   18.7 -
HC:      222.9  mm     G. Age:  24w 2d         39  %    HC/AC:      1.16       1.05 -
AC:      192.5  mm     G. Age:  24w 0d         36  %    FL/BPD:     70.6   %   71 - 87
FL:       42.5  mm     G. Age:  23w 6d         29  %    FL/AC:      22.1   %   20 - 24
HUM:      41.4  mm     G. Age:  25w 0d         63  %

Est. FW:     650  gm      1 lb 7 oz     49  %
Gestational Age

LMP:           24w 1d       Date:   10/15/16                 EDD:   07/22/17
U/S Today:     24w 1d                                        EDD:   07/22/17
Best:          24w 1d    Det. By:   LMP  (10/15/16)          EDD:   07/22/17
Anatomy

Cranium:               Appears normal         Aortic Arch:            Not well visualized
Cavum:                 Appears normal         Ductal Arch:            Appears normal
Ventricles:            Appears normal         Diaphragm:              Appears normal
Choroid Plexus:        Appears normal         Stomach:                Appears normal, left
sided
Cerebellum:            Appears normal         Abdomen:                Appears normal
Posterior Fossa:       Appears normal         Abdominal Wall:         Appears nml (cord
insert, abd wall)
Nuchal Fold:           Not applicable (>20    Cord Vessels:           Appears normal (3
wks GA)                                        vessel cord)
Face:                  Appears normal         Kidneys:                Appear normal
(orbits and profile)
Lips:                  Appears normal         Bladder:                Appears normal
Thoracic:              Appears normal         Spine:                  Not well visualized
Heart:                 Appears normal         Upper Extremities:      Appears normal
(4CH, axis, and situs
RVOT:                  Appears normal         Lower Extremities:      Appears normal
LVOT:                  Appears normal

Other:  Fetus appears to be a male. Heels visualized.
Cervix Uterus Adnexa

Cervix
Length:            3.2  cm.
Normal appearance by transabdominal scan.

Uterus
No abnormality visualized.

Left Ovary
No adnexal mass visualized.

Right Ovary
No adnexal mass visualized.

Cul De Sac:   No free fluid seen.

Adnexa:       No abnormality visualized.
Impression

Singleton intrauterine pregnancy at 24+1 weeks with HIV.
Here for anatomic survey
Review of the anatomy shows no sonographic markers for
aneuploidy or structural anomalies
However, spine evaluation should be considered suboptimal
secondary to fetal position
Amniotic fluid volume is normal
Estimated fetal weight is 650g which is growth in the 49th
percentile
Recommendations

Recommend repeat evaluation to assess spinal anatomy in 4-
6 weeks

## 2018-01-28 ENCOUNTER — Other Ambulatory Visit: Payer: Self-pay | Admitting: Internal Medicine

## 2018-05-17 DIAGNOSIS — B20 Human immunodeficiency virus [HIV] disease: Secondary | ICD-10-CM | POA: Diagnosis not present

## 2018-05-17 DIAGNOSIS — Z79899 Other long term (current) drug therapy: Secondary | ICD-10-CM | POA: Diagnosis not present

## 2018-05-27 DIAGNOSIS — B20 Human immunodeficiency virus [HIV] disease: Secondary | ICD-10-CM | POA: Diagnosis not present

## 2018-05-27 DIAGNOSIS — Z79899 Other long term (current) drug therapy: Secondary | ICD-10-CM | POA: Diagnosis not present

## 2018-07-21 DIAGNOSIS — E6609 Other obesity due to excess calories: Secondary | ICD-10-CM | POA: Diagnosis not present

## 2018-07-21 DIAGNOSIS — E663 Overweight: Secondary | ICD-10-CM | POA: Diagnosis not present

## 2018-07-21 DIAGNOSIS — Z130 Encounter for screening for diseases of the blood and blood-forming organs and certain disorders involving the immune mechanism: Secondary | ICD-10-CM | POA: Diagnosis not present

## 2018-07-21 DIAGNOSIS — B20 Human immunodeficiency virus [HIV] disease: Secondary | ICD-10-CM | POA: Diagnosis not present

## 2018-07-21 DIAGNOSIS — Z0189 Encounter for other specified special examinations: Secondary | ICD-10-CM | POA: Diagnosis not present

## 2018-11-15 DIAGNOSIS — N390 Urinary tract infection, site not specified: Secondary | ICD-10-CM | POA: Diagnosis not present

## 2018-11-15 DIAGNOSIS — R7309 Other abnormal glucose: Secondary | ICD-10-CM | POA: Diagnosis not present

## 2018-11-15 DIAGNOSIS — N3091 Cystitis, unspecified with hematuria: Secondary | ICD-10-CM | POA: Diagnosis not present

## 2018-11-15 DIAGNOSIS — R52 Pain, unspecified: Secondary | ICD-10-CM | POA: Diagnosis not present

## 2018-11-15 DIAGNOSIS — Z3201 Encounter for pregnancy test, result positive: Secondary | ICD-10-CM | POA: Diagnosis not present

## 2018-11-15 DIAGNOSIS — E559 Vitamin D deficiency, unspecified: Secondary | ICD-10-CM | POA: Diagnosis not present

## 2018-11-15 DIAGNOSIS — R110 Nausea: Secondary | ICD-10-CM | POA: Diagnosis not present

## 2018-11-15 DIAGNOSIS — E663 Overweight: Secondary | ICD-10-CM | POA: Diagnosis not present

## 2018-11-15 DIAGNOSIS — R0602 Shortness of breath: Secondary | ICD-10-CM | POA: Diagnosis not present

## 2018-11-15 DIAGNOSIS — R5383 Other fatigue: Secondary | ICD-10-CM | POA: Diagnosis not present

## 2018-11-15 DIAGNOSIS — R300 Dysuria: Secondary | ICD-10-CM | POA: Diagnosis not present

## 2018-12-30 ENCOUNTER — Other Ambulatory Visit (HOSPITAL_COMMUNITY)
Admission: RE | Admit: 2018-12-30 | Discharge: 2018-12-30 | Disposition: A | Discharge status: Home / Self Care | Payer: Medicaid Other | Source: Ambulatory Visit | Attending: Obstetrics & Gynecology | Admitting: Obstetrics & Gynecology

## 2018-12-30 ENCOUNTER — Ambulatory Visit (INDEPENDENT_AMBULATORY_CARE_PROVIDER_SITE_OTHER): Payer: Medicaid Other | Admitting: Obstetrics & Gynecology

## 2018-12-30 ENCOUNTER — Encounter: Payer: Self-pay | Admitting: Obstetrics & Gynecology

## 2018-12-30 VITALS — BP 143/90 | HR 106 | Wt 177.8 lb

## 2018-12-30 DIAGNOSIS — O0990 Supervision of high risk pregnancy, unspecified, unspecified trimester: Secondary | ICD-10-CM | POA: Diagnosis not present

## 2018-12-30 DIAGNOSIS — B951 Streptococcus, group B, as the cause of diseases classified elsewhere: Secondary | ICD-10-CM

## 2018-12-30 DIAGNOSIS — Z3481 Encounter for supervision of other normal pregnancy, first trimester: Secondary | ICD-10-CM | POA: Diagnosis not present

## 2018-12-30 DIAGNOSIS — O2342 Unspecified infection of urinary tract in pregnancy, second trimester: Secondary | ICD-10-CM

## 2018-12-30 DIAGNOSIS — O0992 Supervision of high risk pregnancy, unspecified, second trimester: Secondary | ICD-10-CM

## 2018-12-30 MED ORDER — PROMETHAZINE HCL 25 MG PO TABS: 25.0000 mg | ORAL_TABLET | Freq: Four times a day (QID) | ORAL | 2 refills | Status: DC | PRN

## 2018-12-30 MED ORDER — ASPIRIN EC 81 MG PO TBEC: 81.0000 mg | DELAYED_RELEASE_TABLET | Freq: Every day | ORAL | 1 refills | Status: DC

## 2018-12-30 NOTE — Patient Instructions (Signed)

## 2018-12-30 NOTE — Progress Notes (Signed)
  Subjective:    Beth George is a G2P1001 [redacted]w[redacted]d being seen today for her first obstetrical visit.  Her obstetrical history is significant for HIV, vaginal bleeding. Patient does not intend to breast feed. Pregnancy history fully reviewed.  Patient reports nausea.  Vitals:   12/30/18 1408  BP: (!) 143/90  Pulse: (!) 106  Weight: 177 lb 12.8 oz (80.6 kg)    HISTORY: OB History  Gravida Para Term Preterm AB Living  2 1 1     1   SAB TAB Ectopic Multiple Live Births        0 1    # Outcome Date GA Lbr Len/2nd Weight Sex Delivery Anes PTL Lv  2 Current           1 Term 07/13/17 [redacted]w[redacted]d / 00:43 7 lb 0.2 oz (3.181 kg) M Vag-Spont EPI  LIV   Past Medical History:  Diagnosis Date  . HIV disease affecting pregnancy 03/05/2017   CD4 586 On medication per med list Followed at [facility].    History reviewed. No pertinent surgical history. Family History  Problem Relation Age of Onset  . Cancer Father   . ADD / ADHD Mother   . Asthma Mother   . HIV Mother   . Anxiety disorder Mother      Exam    Uterus:     Pelvic Exam:    Perineum: No Hemorrhoids   Vulva: normal   Vagina:  curdlike discharge, scant blood   pH:     Cervix: small polyp   Adnexa: not evaluated   Bony Pelvis: average  System: Breast:  Inspection negative   Skin: normal coloration and turgor, no rashes    Neurologic: oriented, normal mood   Extremities: normal strength, tone, and muscle mass   HEENT PERRLA   Mouth/Teeth dental hygiene good   Neck supple   Cardiovascular: regular rate and rhythm   Respiratory:  appears well, vitals normal, no respiratory distress, acyanotic, normal RR   Abdomen: soft, non-tender; bowel sounds normal; no masses,  no organomegaly   Urinary: urethral meatus normal      Assessment:    Pregnancy: G2P1001 Patient Active Problem List   Diagnosis Date Noted  . Supervision of high risk pregnancy, antepartum 12/30/2018  . Anemia in  pregnancy 05/19/2017  . HIV disease affecting pregnancy 03/05/2017        Plan:     Initial labs drawn. Prenatal vitamins. Problem list reviewed and updated. Genetic Screening discussed panorama ordered.  Ultrasound discussed; fetal survey: ordered.  Follow up in 4 weeks. 50% of 30 min visit spent on counseling and coordination of care.  Continue present medication. Consider ASA daily. Baseline labs   Scheryl Darter 12/30/2018

## 2018-12-31 LAB — PROTEIN / CREATININE RATIO, URINE
Creatinine, Urine: 180.6 mg/dL
Protein, Ur: 19.6 mg/dL
Protein/Creat Ratio: 109 mg/g creat (ref 0–200)

## 2019-01-03 ENCOUNTER — Other Ambulatory Visit: Payer: Self-pay | Admitting: Obstetrics & Gynecology

## 2019-01-03 DIAGNOSIS — O0990 Supervision of high risk pregnancy, unspecified, unspecified trimester: Secondary | ICD-10-CM

## 2019-01-03 LAB — CYTOLOGY - PAP
Bacterial vaginitis: POSITIVE — AB
Candida vaginitis: NEGATIVE
Chlamydia: NEGATIVE
Diagnosis: NEGATIVE
Diagnosis: REACTIVE
NEISSERIA GONORRHEA: NEGATIVE
Trichomonas: POSITIVE — AB

## 2019-01-03 MED ORDER — METRONIDAZOLE 500 MG PO TABS: 500.0000 mg | ORAL_TABLET | Freq: Two times a day (BID) | ORAL | 0 refills | Status: AC

## 2019-01-03 NOTE — Progress Notes (Signed)
Flagyl Rx sent for trichomonas and BV

## 2019-01-04 ENCOUNTER — Telehealth: Payer: Self-pay

## 2019-01-04 ENCOUNTER — Encounter: Payer: Self-pay | Admitting: *Deleted

## 2019-01-04 LAB — URINE CULTURE, OB REFLEX

## 2019-01-04 LAB — CULTURE, OB URINE

## 2019-01-04 NOTE — Telephone Encounter (Signed)
Called pt with Pap Smear results, no answer, left VM for call back.

## 2019-01-04 NOTE — Telephone Encounter (Signed)
Called patient to inform her of test results. No answer or voice mail to leave a message. 

## 2019-01-04 NOTE — Telephone Encounter (Signed)
-----   Message from Adam Phenix, MD sent at 01/03/2019  3:57 PM EST ----- Rx for Flagyl for positive trichomonas and BV. Pap o/w normal

## 2019-01-05 ENCOUNTER — Ambulatory Visit (HOSPITAL_COMMUNITY)
Admission: RE | Admit: 2019-01-05 | Discharge: 2019-01-05 | Disposition: A | Discharge status: Home / Self Care | Payer: Medicaid Other | Source: Ambulatory Visit | Attending: Obstetrics & Gynecology | Admitting: Obstetrics & Gynecology

## 2019-01-05 ENCOUNTER — Other Ambulatory Visit: Payer: Self-pay | Admitting: Obstetrics & Gynecology

## 2019-01-05 ENCOUNTER — Telehealth: Payer: Self-pay

## 2019-01-05 ENCOUNTER — Other Ambulatory Visit (HOSPITAL_COMMUNITY): Payer: Self-pay | Admitting: *Deleted

## 2019-01-05 ENCOUNTER — Encounter (HOSPITAL_COMMUNITY): Payer: Self-pay

## 2019-01-05 DIAGNOSIS — O2342 Unspecified infection of urinary tract in pregnancy, second trimester: Principal | ICD-10-CM

## 2019-01-05 DIAGNOSIS — Z363 Encounter for antenatal screening for malformations: Secondary | ICD-10-CM | POA: Diagnosis not present

## 2019-01-05 DIAGNOSIS — O98712 Human immunodeficiency virus [HIV] disease complicating pregnancy, second trimester: Secondary | ICD-10-CM

## 2019-01-05 DIAGNOSIS — B20 Human immunodeficiency virus [HIV] disease: Secondary | ICD-10-CM

## 2019-01-05 DIAGNOSIS — O2340 Unspecified infection of urinary tract in pregnancy, unspecified trimester: Secondary | ICD-10-CM

## 2019-01-05 DIAGNOSIS — Z3A15 15 weeks gestation of pregnancy: Secondary | ICD-10-CM | POA: Diagnosis not present

## 2019-01-05 DIAGNOSIS — O99212 Obesity complicating pregnancy, second trimester: Secondary | ICD-10-CM

## 2019-01-05 DIAGNOSIS — B951 Streptococcus, group B, as the cause of diseases classified elsewhere: Secondary | ICD-10-CM

## 2019-01-05 DIAGNOSIS — O0990 Supervision of high risk pregnancy, unspecified, unspecified trimester: Secondary | ICD-10-CM | POA: Diagnosis not present

## 2019-01-05 DIAGNOSIS — Z3687 Encounter for antenatal screening for uncertain dates: Secondary | ICD-10-CM

## 2019-01-05 MED ORDER — AMPICILLIN 500 MG PO CAPS: 500.0000 mg | ORAL_CAPSULE | Freq: Three times a day (TID) | ORAL | 0 refills | Status: DC

## 2019-01-05 NOTE — Telephone Encounter (Addendum)
-----   Message from Adam Phenix, MD sent at 01/05/2019  9:54 AM EST ----- Positive GBS in urine, ampicillin Rx sent to [facility]  LM for pt to return call in regards to her last appt and that she has a UTI that she can go to [facility] on [address] To pick up Rx for UTI.

## 2019-01-05 NOTE — Telephone Encounter (Signed)
Notified Dr. Debroah Loop that the prenatal labs the pt was drawn and we are waiting on her SMA to result for all labs to result in Epic.  I also informed provider that the genetic screening test will result as well.   No further requests by the provider.

## 2019-01-05 NOTE — Telephone Encounter (Signed)
3rd attempt to contact Pt about Test results of Pap,no answer left message for call back & also stated will send Certified Letter.

## 2019-01-11 ENCOUNTER — Other Ambulatory Visit: Payer: Self-pay | Admitting: Obstetrics & Gynecology

## 2019-01-11 ENCOUNTER — Telehealth: Payer: Self-pay

## 2019-01-11 DIAGNOSIS — A5901 Trichomonal vulvovaginitis: Secondary | ICD-10-CM

## 2019-01-11 LAB — COMPREHENSIVE METABOLIC PANEL
ALT: 7 IU/L (ref 0–32)
AST: 12 IU/L (ref 0–40)
Albumin/Globulin Ratio: 1.1 — ABNORMAL LOW (ref 1.2–2.2)
Albumin: 3.9 g/dL (ref 3.9–5.0)
Alkaline Phosphatase: 61 IU/L (ref 39–117)
BUN/Creatinine Ratio: 7 — ABNORMAL LOW (ref 9–23)
BUN: 4 mg/dL — AB (ref 6–20)
Bilirubin Total: <0.2 mg/dL (ref 0.0–1.2)
CO2: 21 mmol/L (ref 20–29)
CREATININE: 0.54 mg/dL — AB (ref 0.57–1.00)
Calcium: 9.1 mg/dL (ref 8.7–10.2)
Chloride: 100 mmol/L (ref 96–106)
GFR calc Af Amer: 154 mL/min/{1.73_m2} (ref >59)
GFR calc non Af Amer: 133 mL/min/{1.73_m2} (ref >59)
GLUCOSE: 93 mg/dL (ref 65–99)
Globulin, Total: 3.7 g/dL (ref 1.5–4.5)
Potassium: 3.6 mmol/L (ref 3.5–5.2)
Sodium: 135 mmol/L (ref 134–144)
Total Protein: 7.6 g/dL (ref 6.0–8.5)

## 2019-01-11 LAB — HEMOGLOBINOPATHY EVALUATION
Ferritin: 10 ng/mL — ABNORMAL LOW (ref 15–150)
HGB S: 0 %
Hgb A2 Quant: 1.8 % (ref 1.8–3.2)
Hgb A: 98.2 % (ref 96.4–98.8)
Hgb C: 0 %
Hgb F Quant: 0 % (ref 0.0–2.0)
Hgb Solubility: NEGATIVE
Hgb Variant: 0 %

## 2019-01-11 LAB — OBSTETRIC PANEL, INCLUDING HIV
ANTIBODY SCREEN: NEGATIVE
BASOS: 0 %
Basophils Absolute: 0 10*3/uL (ref 0.0–0.2)
EOS (ABSOLUTE): 0 10*3/uL (ref 0.0–0.4)
Eos: 0 %
HIV Screen 4th Generation wRfx: REACTIVE — AB
Hematocrit: 32.7 % — ABNORMAL LOW (ref 34.0–46.6)
Hemoglobin: 11 g/dL — ABNORMAL LOW (ref 11.1–15.9)
Hepatitis B Surface Ag: NEGATIVE
Immature Grans (Abs): 0 10*3/uL (ref 0.0–0.1)
Immature Granulocytes: 0 %
LYMPHS: 25 %
Lymphocytes Absolute: 1.9 10*3/uL (ref 0.7–3.1)
MCH: 26.6 pg (ref 26.6–33.0)
MCHC: 33.6 g/dL (ref 31.5–35.7)
MCV: 79 fL (ref 79–97)
Monocytes Absolute: 0.7 10*3/uL (ref 0.1–0.9)
Monocytes: 10 %
Neutrophils Absolute: 5 10*3/uL (ref 1.4–7.0)
Neutrophils: 65 %
Platelets: 363 10*3/uL (ref 150–450)
RBC: 4.13 x10E6/uL (ref 3.77–5.28)
RDW: 15.5 % — ABNORMAL HIGH (ref 11.7–15.4)
RPR Ser Ql: NONREACTIVE
Rh Factor: POSITIVE
Rubella Antibodies, IGG: 3.25 index (ref >0.99)
WBC: 7.7 10*3/uL (ref 3.4–10.8)

## 2019-01-11 LAB — INHERITEST(R) CF/SMA PANEL

## 2019-01-11 LAB — HIV 1/2 AB DIFFERENTIATION
HIV 1 Ab: POSITIVE — AB
HIV 2 Ab: NEGATIVE
NOTE (HIV CONF MULTIP: POSITIVE

## 2019-01-11 LAB — HEMOGLOBIN A1C
Est. average glucose Bld gHb Est-mCnc: 123 mg/dL
Hgb A1c MFr Bld: 5.9 % — ABNORMAL HIGH (ref 4.8–5.6)

## 2019-01-11 MED ORDER — METRONIDAZOLE 500 MG PO TABS: ORAL_TABLET | ORAL | 0 refills | Status: DC

## 2019-01-11 NOTE — Telephone Encounter (Addendum)
-----   Message from Adam Phenix, MD sent at 01/11/2019  5:40 PM EST ----- Flagyl Rx was called for Trichomonas on pap  LM for pt that we are calling with results if she could please give the office a call back.

## 2019-01-11 NOTE — Progress Notes (Signed)
Flagyl for trichomonas 

## 2019-01-12 NOTE — Telephone Encounter (Signed)
LM for pt that this is our second attempt and we are calling with results and that we have sent an Rx to her [facility] pharmacy on [address] in [place].  Letter sent.

## 2019-01-27 ENCOUNTER — Encounter: Payer: Self-pay | Admitting: Obstetrics and Gynecology

## 2019-02-01 ENCOUNTER — Encounter: Payer: Self-pay | Admitting: *Deleted

## 2019-02-02 ENCOUNTER — Ambulatory Visit (HOSPITAL_COMMUNITY): Payer: Medicaid Other

## 2019-02-03 ENCOUNTER — Encounter: Payer: Self-pay | Admitting: Medical

## 2019-02-03 ENCOUNTER — Telehealth: Payer: Self-pay | Admitting: Obstetrics & Gynecology

## 2019-02-03 NOTE — Telephone Encounter (Signed)
Patient rescheduled her appointment due to no babysitter. Informed the patient of the restrictions. She has no questions and understands.

## 2019-02-07 ENCOUNTER — Encounter: Payer: Self-pay | Admitting: *Deleted

## 2019-02-08 ENCOUNTER — Encounter: Payer: Self-pay | Admitting: Student

## 2019-02-16 ENCOUNTER — Ambulatory Visit (HOSPITAL_COMMUNITY): Payer: Medicaid Other

## 2019-02-23 ENCOUNTER — Encounter: Payer: Self-pay | Admitting: *Deleted

## 2019-02-24 ENCOUNTER — Encounter: Payer: Self-pay | Admitting: Obstetrics and Gynecology

## 2019-03-16 ENCOUNTER — Other Ambulatory Visit: Payer: Self-pay

## 2019-03-16 ENCOUNTER — Ambulatory Visit (HOSPITAL_COMMUNITY): Payer: Medicaid Other | Admitting: *Deleted

## 2019-03-16 ENCOUNTER — Encounter (HOSPITAL_COMMUNITY): Payer: Self-pay

## 2019-03-16 ENCOUNTER — Other Ambulatory Visit (HOSPITAL_COMMUNITY): Payer: Self-pay | Admitting: *Deleted

## 2019-03-16 ENCOUNTER — Ambulatory Visit (HOSPITAL_COMMUNITY)
Admission: RE | Admit: 2019-03-16 | Discharge: 2019-03-16 | Disposition: A | Discharge status: Home / Self Care | Payer: Medicaid Other | Source: Ambulatory Visit | Attending: Obstetrics and Gynecology | Admitting: Obstetrics and Gynecology

## 2019-03-16 VITALS — Temp 98.7°F

## 2019-03-16 DIAGNOSIS — Z21 Asymptomatic human immunodeficiency virus [HIV] infection status: Principal | ICD-10-CM

## 2019-03-16 DIAGNOSIS — B20 Human immunodeficiency virus [HIV] disease: Secondary | ICD-10-CM | POA: Diagnosis present

## 2019-03-16 DIAGNOSIS — Z362 Encounter for other antenatal screening follow-up: Secondary | ICD-10-CM | POA: Diagnosis not present

## 2019-03-16 DIAGNOSIS — O98719 Human immunodeficiency virus [HIV] disease complicating pregnancy, unspecified trimester: Secondary | ICD-10-CM

## 2019-03-16 DIAGNOSIS — O0990 Supervision of high risk pregnancy, unspecified, unspecified trimester: Secondary | ICD-10-CM | POA: Insufficient documentation

## 2019-03-17 ENCOUNTER — Telehealth: Payer: Self-pay

## 2019-03-17 DIAGNOSIS — O0990 Supervision of high risk pregnancy, unspecified, unspecified trimester: Secondary | ICD-10-CM

## 2019-03-17 NOTE — Telephone Encounter (Signed)
Called patient to verify if patient had BP cuff. Patient does not and has not been signed up for [employer]. Order placed and instructed patient how to enroll and use BP cuff for proper readings.  Rolm Bookbinder, CNM 03/17/19 10:37 AM

## 2019-03-24 ENCOUNTER — Telehealth: Payer: Self-pay | Admitting: Obstetrics and Gynecology

## 2019-03-24 NOTE — Telephone Encounter (Signed)
Called the patient to confirm the appointment, left a detailed voicemail message with our new location address and fasting instructions.

## 2019-03-25 ENCOUNTER — Other Ambulatory Visit: Payer: Medicaid Other

## 2019-03-25 ENCOUNTER — Telehealth: Payer: Self-pay | Admitting: Obstetrics and Gynecology

## 2019-03-25 ENCOUNTER — Other Ambulatory Visit: Payer: Self-pay | Admitting: *Deleted

## 2019-03-25 ENCOUNTER — Encounter: Payer: Medicaid Other | Admitting: Obstetrics and Gynecology

## 2019-03-25 DIAGNOSIS — O0990 Supervision of high risk pregnancy, unspecified, unspecified trimester: Secondary | ICD-10-CM

## 2019-03-25 NOTE — Telephone Encounter (Signed)
The patient stated she did not know the time of her appointment. She did not play the voicemail left for her yesterday. Rescheduled the appointment after consulting with Dr. Vergie Living and CMA Joni Reining.

## 2019-03-25 NOTE — Telephone Encounter (Signed)
The patient called in stating she would be 20 mins late. Informed the patient of the rescheduled visit after consulting with the CMA and doctor. The patient stated she would write it down as its to late to mail a letter.

## 2019-03-28 ENCOUNTER — Telehealth: Payer: Self-pay | Admitting: Obstetrics & Gynecology

## 2019-03-28 NOTE — Telephone Encounter (Signed)
Called the patient to confirm the appointment, left a detailed voicemail message with our new location and appointment time. °

## 2019-03-29 ENCOUNTER — Other Ambulatory Visit: Payer: Medicaid Other

## 2019-03-29 ENCOUNTER — Other Ambulatory Visit (HOSPITAL_COMMUNITY)
Admission: RE | Admit: 2019-03-29 | Discharge: 2019-03-29 | Disposition: A | Discharge status: Home / Self Care | Payer: Medicaid Other | Source: Ambulatory Visit | Attending: Obstetrics and Gynecology | Admitting: Obstetrics and Gynecology

## 2019-03-29 ENCOUNTER — Ambulatory Visit (INDEPENDENT_AMBULATORY_CARE_PROVIDER_SITE_OTHER): Payer: Medicaid Other | Admitting: Student

## 2019-03-29 ENCOUNTER — Other Ambulatory Visit: Payer: Self-pay

## 2019-03-29 VITALS — BP 135/87 | HR 112 | Temp 97.9°F | Wt 188.0 lb

## 2019-03-29 DIAGNOSIS — A5901 Trichomonal vulvovaginitis: Secondary | ICD-10-CM | POA: Diagnosis not present

## 2019-03-29 DIAGNOSIS — O0990 Supervision of high risk pregnancy, unspecified, unspecified trimester: Secondary | ICD-10-CM

## 2019-03-29 DIAGNOSIS — O98513 Other viral diseases complicating pregnancy, third trimester: Secondary | ICD-10-CM

## 2019-03-29 DIAGNOSIS — B951 Streptococcus, group B, as the cause of diseases classified elsewhere: Secondary | ICD-10-CM

## 2019-03-29 DIAGNOSIS — Z23 Encounter for immunization: Secondary | ICD-10-CM | POA: Diagnosis not present

## 2019-03-29 DIAGNOSIS — O2340 Unspecified infection of urinary tract in pregnancy, unspecified trimester: Secondary | ICD-10-CM

## 2019-03-29 DIAGNOSIS — Z3A27 27 weeks gestation of pregnancy: Secondary | ICD-10-CM

## 2019-03-29 MED ORDER — AMBULATORY NON FORMULARY MEDICATION: 1.0000 | 0 refills | Status: DC

## 2019-03-29 NOTE — Patient Instructions (Signed)
-  please purchase [employer] jelly beans; may need to buy online -bring to appt next week; do not open bag -you should be fasting when you come to your appt next week -your next visit will be virtual in two weeks

## 2019-03-29 NOTE — Progress Notes (Signed)
PRENATAL VISIT NOTE  Subjective:  Beth George is a 23 y.o. G2P1001 at 58w5dbeing seen today for ongoing prenatal care.  She is currently monitored for the following issues for this low-risk pregnancy and has HIV disease affecting pregnancy; Anemia in pregnancy; Supervision of high risk pregnancy, antepartum; and Group B streptococcus urinary tract infection affecting pregnancy on their problem list.  Patient reports no complaints. She has tried twice for a 2 hour GTT, but has thrown up both times, including today. She is agreeable to order Brachs jelly beans and return next week. She has appt with ID on 5-14 for her HIV management; is still taking her meds. She has not received her cuff at home yet but she does have the [employer] Says that she did not use the baby Rx app because her gestational age said 374 weeksand she is actually 280 weeks   Contractions: Not present. Vag. Bleeding: None.  Movement: Present. Denies leaking of fluid.   The following portions of the patient's history were reviewed and updated as appropriate: allergies, current medications, past family history, past medical history, past social history, past surgical history and problem list.   Objective:   Vitals:   03/29/19 0852  BP: 135/87  Pulse: (!) 112  Temp: 97.9 F (36.6 C)  Weight: 188 lb (85.3 kg)    Fetal Status: Fetal Heart Rate (bpm): 147 Fundal Height: 28 cm Movement: Present     General:  Alert, oriented and cooperative. Patient is in no acute distress.  Skin: Skin is warm and dry. No rash noted.   Cardiovascular: Normal heart rate noted  Respiratory: Normal respiratory effort, no problems with respiration noted  Abdomen: Soft, gravid, appropriate for gestational age.  Pain/Pressure: Present     Pelvic: Cervical exam deferred        Extremities: Normal range of motion.  Edema: None  Mental Status: Normal mood and affect. Normal behavior. Normal judgment and thought content.   Assessment  and Plan:  Pregnancy: G2P1001 at 255w5d. Group B Streptococcus urinary tract infection affecting pregnancy, antepartum  - Culture, OB Urine  2. Trichomonal vaginitis  - Cervicovaginal ancillary only( [place])  3. Supervision of high risk pregnancy, antepartum -Instructions given on how to order Brachs jelly beans, come to lab draw fasting and bring unopened Brachs package.  -Order placed for BP cuff; explained that she can still enter values in BabyRx even though her GA is not correct. -Reviewed warning signs and when to return to MAU.  -Wants to do patch or ring for contraception; no breastfeeding due to HIV - AMBULATORY NON FORMULARY MEDICATION; 1 Device by Other route once a week. Blood Pressure Cuff/ Medium Monitored Regularly at home ICD 10: O09.90  Dispense: 1 kit; Refill: 0 - Tdap vaccine greater than or equal to 7yo IM -Has follow up growth in two weeks and ID appt on 5-14  Preterm labor symptoms and general obstetric precautions including but not limited to vaginal bleeding, contractions, leaking of fluid and fetal movement were reviewed in detail with the patient. Please refer to After Visit Summary for other counseling recommendations.   Return in about 2 weeks (around 04/12/2019), or for virtual visit and 1 week for 2 hour GTT.  Future Appointments  Date Time Provider [place]5/19/2020  8:50 AM WOC-WOCA LAB WOC-WOCA WOC  04/12/2019  2:55 PM BuEphraim HamburgereRona RavensNP [place]5/27/2020 10:15 AM WH-MFC NURSE WH-MFC MFC-US  04/13/2019 10:15  AM WH-MFC [place], CNM

## 2019-03-30 DIAGNOSIS — B20 Human immunodeficiency virus [HIV] disease: Secondary | ICD-10-CM | POA: Diagnosis not present

## 2019-03-30 LAB — CBC
Hematocrit: 28.6 % — ABNORMAL LOW (ref 34.0–46.6)
Hemoglobin: 9 g/dL — ABNORMAL LOW (ref 11.1–15.9)
MCH: 24.5 pg — ABNORMAL LOW (ref 26.6–33.0)
MCHC: 31.5 g/dL (ref 31.5–35.7)
MCV: 78 fL — ABNORMAL LOW (ref 79–97)
Platelets: 438 10*3/uL (ref 150–450)
RBC: 3.68 x10E6/uL — ABNORMAL LOW (ref 3.77–5.28)
RDW: 14.8 % (ref 11.7–15.4)
WBC: 10.5 10*3/uL (ref 3.4–10.8)

## 2019-03-30 LAB — HIV 1/2 AB DIFFERENTIATION
HIV 1 Ab: POSITIVE — AB
HIV 2 Ab: NEGATIVE
NOTE (HIV CONF MULTIP: POSITIVE

## 2019-03-30 LAB — CERVICOVAGINAL ANCILLARY ONLY: Trichomonas: NEGATIVE

## 2019-03-30 LAB — GLUCOSE TOLERANCE, 2 HOURS W/ 1HR: Glucose, Fasting: 81 mg/dL (ref 65–91)

## 2019-03-30 LAB — RPR: RPR Ser Ql: NONREACTIVE

## 2019-03-30 LAB — HIV ANTIBODY (ROUTINE TESTING W REFLEX): HIV Screen 4th Generation wRfx: REACTIVE — AB

## 2019-03-31 LAB — URINE CULTURE, OB REFLEX

## 2019-03-31 LAB — CULTURE, OB URINE

## 2019-04-01 ENCOUNTER — Encounter: Payer: Self-pay | Admitting: Student

## 2019-04-01 DIAGNOSIS — O23599 Infection of other part of genital tract in pregnancy, unspecified trimester: Secondary | ICD-10-CM

## 2019-04-01 DIAGNOSIS — A5901 Trichomonal vulvovaginitis: Secondary | ICD-10-CM | POA: Insufficient documentation

## 2019-04-04 ENCOUNTER — Other Ambulatory Visit: Payer: Self-pay | Admitting: Student

## 2019-04-04 DIAGNOSIS — O0990 Supervision of high risk pregnancy, unspecified, unspecified trimester: Secondary | ICD-10-CM | POA: Diagnosis not present

## 2019-04-05 ENCOUNTER — Other Ambulatory Visit: Payer: Self-pay

## 2019-04-05 ENCOUNTER — Other Ambulatory Visit: Payer: Medicaid Other

## 2019-04-05 DIAGNOSIS — O0990 Supervision of high risk pregnancy, unspecified, unspecified trimester: Secondary | ICD-10-CM | POA: Diagnosis not present

## 2019-04-07 ENCOUNTER — Other Ambulatory Visit: Payer: Self-pay | Admitting: Student

## 2019-04-07 DIAGNOSIS — O99013 Anemia complicating pregnancy, third trimester: Secondary | ICD-10-CM

## 2019-04-08 LAB — GLUCOSE TOLERANCE, 2 HOURS W/ 1HR
Glucose, 1 hour: 143 mg/dL (ref 65–179)
Glucose, 2 hour: 92 mg/dL (ref 65–152)
Glucose, Fasting: 83 mg/dL (ref 65–91)

## 2019-04-12 ENCOUNTER — Ambulatory Visit (INDEPENDENT_AMBULATORY_CARE_PROVIDER_SITE_OTHER): Payer: Medicaid Other | Admitting: Nurse Practitioner

## 2019-04-12 ENCOUNTER — Other Ambulatory Visit: Payer: Self-pay

## 2019-04-12 ENCOUNTER — Telehealth: Payer: Self-pay | Admitting: Nurse Practitioner

## 2019-04-12 ENCOUNTER — Telehealth: Payer: Self-pay | Admitting: Lactation Services

## 2019-04-12 VITALS — BP 121/84 | HR 109 | Wt 191.0 lb

## 2019-04-12 DIAGNOSIS — O98719 Human immunodeficiency virus [HIV] disease complicating pregnancy, unspecified trimester: Secondary | ICD-10-CM

## 2019-04-12 DIAGNOSIS — Z3A29 29 weeks gestation of pregnancy: Secondary | ICD-10-CM

## 2019-04-12 DIAGNOSIS — O0993 Supervision of high risk pregnancy, unspecified, third trimester: Secondary | ICD-10-CM

## 2019-04-12 DIAGNOSIS — O0990 Supervision of high risk pregnancy, unspecified, unspecified trimester: Secondary | ICD-10-CM

## 2019-04-12 NOTE — Telephone Encounter (Signed)
Called and spoke with patient in regards to ordered and scheduled Faraheme infusion.   Pt asked what it was and why it was ordered. Discussed the provider ordered due to low Iron. Pt voiced understanding. Informed pt she will have 2 infusions a week apart.   Informed pt she will need to go to [facility] Main entrance and go to admitting. Pt was informed her appt is June 2 at 9 am. Pt voiced understanding.   Pt to call back with any questions/concerns as needed.

## 2019-04-12 NOTE — Telephone Encounter (Signed)
Attempted to call patient with her follow-up appointment ( 6/9 @ 3:15-virtual). No answer, left appointment information on voicemail. Reminder letter mailed

## 2019-04-12 NOTE — Telephone Encounter (Signed)
-----   Message from Marylene Land, CNM sent at 04/07/2019  2:21 PM EDT ----- This patient needs a feraheme infusion weekly for two weeks; do you mind ordering it and scheduling it? Thank  You so much!

## 2019-04-12 NOTE — Progress Notes (Signed)
I connected with  Donnajean Lopes on 04/12/19 at  2:55 PM EDT by telephone and verified that I am speaking with the correct person using two identifiers.   I discussed the limitations, risks, security and privacy concerns of performing an evaluation and management service by telephone and the availability of in person appointments. I also discussed with the patient that there may be a patient responsible charge related to this service. The patient expressed understanding and agreed to proceed.  Tessalyn is not active in [employer].States it has her EDD wrong, I informed her I will send them a message to change her EDD; then she can log in bp weekly.   Instructed to stop ibuprofen- last taken yesterday.  Zaul Hubers,RN 04/12/2019  2:39 PM

## 2019-04-12 NOTE — Progress Notes (Addendum)
I connected with@ on 04/12/19 at  2:55 PM EDT by: Webex video and verified that I am speaking with the correct person using two identifiers.  Patient is located at home and provider is located at [place] office    The purpose of this virtual visit is to provide medical care while limiting exposure to the novel coronavirus. I discussed the limitations, risks, security and privacy concerns of performing an evaluation and management service by Nolene Bernheimerri , NP and the availability of in person appointments. I also discussed with the patient that there may be a patient responsible charge related to this service. By engaging in this virtual visit, you consent to the provision of healthcare.  Additionally, you authorize for your insurance to be billed for the services provided during this visit.  The patient expressed understanding and agreed to proceed.  The following staff members participated in the virtual visit:  Raynald BlendLinda Zeyfang, RN and Nolene Bernheimerri , NP    PRENATAL VISIT NOTE  Subjective:  Beth LopesJameka L George is a 23 y.o. G2P1001 at 7464w5d  for phone visit for ongoing prenatal care.  She is currently monitored for the following issues for this high-risk pregnancy and has HIV disease affecting pregnancy; Anemia in pregnancy; Supervision of high risk pregnancy, antepartum; Group B streptococcus urinary tract infection affecting pregnancy; and Trichomonal vaginitis in pregnancy on their problem list.  Patient reports no complaints.  Contractions: Irregular. Vag. Bleeding: None.  Movement: Present. Denies leaking of fluid.   The following portions of the patient's history were reviewed and updated as appropriate: allergies, current medications, past family history, past medical history, past social history, past surgical history and problem list.   Objective:   Vitals:   04/12/19 1453  BP: 121/84  Pulse: (!) 109  Weight: 191 lb (86.6 kg)   Self-Obtained  Fetal Status:     Movement: Present      Assessment and Plan:  Pregnancy: G2P1001 at 3864w5d 1. Supervision of high risk pregnancy, antepartum Doing well.  No concerns except that she [facility] her [facility].  She is planning to call her PCP in [place] to see if they will order iron infusion so she can have it close to home.  [place] [place] [place] but does not think she can get child care so she may not be able to make this appointment.  Reports she has MyChart on her phone but checking this after speaking with her lists MyChart pending.    2. HIV disease affecting pregnancy, antepartum Reviewed meds with her and updated medication tab.  One medication that she does take is not listed.  She did not know the exact spelling but stated the drug was "Incitris".  Is still taking Truvada but is not taking the other antivirals listed on her med list - list was updated.  States her viral load is undetectable at this point.  Preterm labor symptoms and general obstetric precautions including but not limited to vaginal bleeding, contractions, leaking of fluid and fetal movement were reviewed in detail with the patient.  Return in about 2 weeks (around 04/26/2019) for virtual Webex  visit for ROB.  Future Appointments  Date Time Provider Department Center  04/13/2019 10:15 AM WH-MFC NURSE WH-MFC MFC-US  04/13/2019 10:15 AM WH-MFC US 4 WH-MFCUS MFC-US  04/19/2019  9:00 AM MC-MDCC ROOM 8 MC-MDCC None  04/26/2019  3:15 PM Rolm BookbinderNeill, Caroline M, CNM WOC-WOCA WOC  Time spent on virtual visit: 15 minutes  Currie Paris, NP Nolene Bernheim, RN, MSN, NP-BC Nurse Practitioner, [facility] for [facility], [facility] 04/12/2019 11:08 PM  - visit completed earlier today.

## 2019-04-13 ENCOUNTER — Encounter (HOSPITAL_COMMUNITY): Payer: Self-pay

## 2019-04-13 ENCOUNTER — Ambulatory Visit (HOSPITAL_COMMUNITY): Payer: Medicaid Other | Admitting: *Deleted

## 2019-04-13 ENCOUNTER — Other Ambulatory Visit (HOSPITAL_COMMUNITY): Payer: Self-pay | Admitting: *Deleted

## 2019-04-13 ENCOUNTER — Ambulatory Visit (HOSPITAL_COMMUNITY)
Admission: RE | Admit: 2019-04-13 | Discharge: 2019-04-13 | Disposition: A | Discharge status: Home / Self Care | Payer: Medicaid Other | Source: Ambulatory Visit | Attending: Maternal & Fetal Medicine | Admitting: Maternal & Fetal Medicine

## 2019-04-13 ENCOUNTER — Other Ambulatory Visit: Payer: Self-pay

## 2019-04-13 VITALS — Temp 98.8°F

## 2019-04-13 DIAGNOSIS — Z362 Encounter for other antenatal screening follow-up: Secondary | ICD-10-CM

## 2019-04-13 DIAGNOSIS — O0990 Supervision of high risk pregnancy, unspecified, unspecified trimester: Secondary | ICD-10-CM | POA: Diagnosis not present

## 2019-04-13 DIAGNOSIS — O09899 Supervision of other high risk pregnancies, unspecified trimester: Secondary | ICD-10-CM

## 2019-04-13 DIAGNOSIS — O98713 Human immunodeficiency virus [HIV] disease complicating pregnancy, third trimester: Secondary | ICD-10-CM | POA: Diagnosis not present

## 2019-04-13 DIAGNOSIS — Z3A29 29 weeks gestation of pregnancy: Secondary | ICD-10-CM

## 2019-04-13 DIAGNOSIS — O99213 Obesity complicating pregnancy, third trimester: Secondary | ICD-10-CM

## 2019-04-13 DIAGNOSIS — O98719 Human immunodeficiency virus [HIV] disease complicating pregnancy, unspecified trimester: Secondary | ICD-10-CM | POA: Diagnosis not present

## 2019-04-13 DIAGNOSIS — Z21 Asymptomatic human immunodeficiency virus [HIV] infection status: Secondary | ICD-10-CM | POA: Diagnosis not present

## 2019-04-14 ENCOUNTER — Telehealth: Payer: Self-pay | Admitting: *Deleted

## 2019-04-14 NOTE — Telephone Encounter (Signed)
Pt left message on the nurse line stating that she was calling regarding her iron and that she lives in [facility] and wants to see if she can get her meds transferred to the new office up there.  She gave the office number of [phone].  Returned pt's call to advise her that she would need to have her office fax [place] a release of records form in order for her records to be sent.  Pt did not pick up.  Left voicemail advising pt that if she continued to have questions or concerns to contact the clinic.

## 2019-04-18 ENCOUNTER — Telehealth: Payer: Self-pay

## 2019-04-18 NOTE — Discharge Instructions (Signed)

## 2019-04-18 NOTE — Telephone Encounter (Signed)
Pt called and left message on Nurse line 04/15/19 at 11:25a, wanting to know since she lives in [place]. Could she get her Calvin Infusion there. Pt requested for [place] to contact a Beth at [phone] to get that set up. [employer] and she stated that the pt would have to have a PCP who has privileges at the hospital to place order. Called Pt back to advise & she states will call Beth with her PCP info.

## 2019-04-19 ENCOUNTER — Inpatient Hospital Stay (HOSPITAL_COMMUNITY)
Admission: RE | Admit: 2019-04-19 | Discharge: 2019-04-19 | Disposition: A | Discharge status: Home / Self Care | Payer: Medicaid Other | Source: Ambulatory Visit | Attending: Student | Admitting: Student

## 2019-04-19 ENCOUNTER — Encounter (HOSPITAL_COMMUNITY): Payer: Self-pay

## 2019-04-20 ENCOUNTER — Telehealth: Payer: Self-pay

## 2019-04-20 NOTE — Telephone Encounter (Signed)
Pt called Nurse line today at 12:53p, providing her PCP office information to set up her Iron Infusion in [place], instead of [place]. Per our previous conversation I advised Pt that she will need to provide that info to [facility] not [place], no answer left VM.

## 2019-04-25 ENCOUNTER — Telehealth: Payer: Self-pay

## 2019-04-25 NOTE — Telephone Encounter (Addendum)
Pt called requesting a f/u in regards to [place] contacting her PCP, [place] @ [phone], about her iron infusion.  LM on pt's VM that I was returning her call and if she continues to have questions or concerns to please give the office a call.

## 2019-04-26 ENCOUNTER — Encounter (HOSPITAL_COMMUNITY): Payer: Medicaid Other

## 2019-04-26 ENCOUNTER — Telehealth: Payer: Medicaid Other | Admitting: Internal Medicine

## 2019-04-26 ENCOUNTER — Telehealth (INDEPENDENT_AMBULATORY_CARE_PROVIDER_SITE_OTHER): Payer: Medicaid Other

## 2019-04-26 ENCOUNTER — Telehealth: Payer: Self-pay

## 2019-04-26 ENCOUNTER — Other Ambulatory Visit: Payer: Self-pay

## 2019-04-26 VITALS — BP 120/89 | HR 111

## 2019-04-26 DIAGNOSIS — O2343 Unspecified infection of urinary tract in pregnancy, third trimester: Secondary | ICD-10-CM

## 2019-04-26 DIAGNOSIS — B951 Streptococcus, group B, as the cause of diseases classified elsewhere: Secondary | ICD-10-CM

## 2019-04-26 DIAGNOSIS — O99013 Anemia complicating pregnancy, third trimester: Secondary | ICD-10-CM

## 2019-04-26 DIAGNOSIS — O0990 Supervision of high risk pregnancy, unspecified, unspecified trimester: Secondary | ICD-10-CM

## 2019-04-26 DIAGNOSIS — O98713 Human immunodeficiency virus [HIV] disease complicating pregnancy, third trimester: Secondary | ICD-10-CM

## 2019-04-26 DIAGNOSIS — O98719 Human immunodeficiency virus [HIV] disease complicating pregnancy, unspecified trimester: Secondary | ICD-10-CM

## 2019-04-26 DIAGNOSIS — Z3A31 31 weeks gestation of pregnancy: Secondary | ICD-10-CM

## 2019-04-26 NOTE — Telephone Encounter (Signed)
Attempted to call patient with her next OB appointment (7/14 @1 :15 - office). No answer, left voicemail with the appointment information and advised to give the office a call if needing to reschedule. Appointment reminder mailed.

## 2019-04-26 NOTE — Progress Notes (Signed)
   TELEHEALTH VIRTUAL OBSTETRICS VISIT ENCOUNTER NOTE  I connected with Beth George on 04/26/19 at  9:15 AM EDT by WebEx at home and verified that I am speaking with the correct person using two identifiers.   I discussed the limitations, risks, security and privacy concerns of performing an evaluation and management service by telephone and the availability of in person appointments. I also discussed with the patient that there may be a patient responsible charge related to this service. The patient expressed understanding and agreed to proceed.  Subjective:  Beth George is a 23 y.o. G2P1001 at [redacted]w[redacted]d being followed for ongoing prenatal care.  She is currently monitored for the following issues for this high-risk pregnancy and has HIV disease affecting pregnancy; Anemia in pregnancy; Supervision of high risk pregnancy, antepartum; Group B streptococcus urinary tract infection affecting pregnancy; and Trichomonal vaginitis in pregnancy on their problem list.  Patient reports no complaints. Reports fetal movement. Denies any contractions, bleeding or leaking of fluid.   The following portions of the patient's history were reviewed and updated as appropriate: allergies, current medications, past family history, past medical history, past social history, past surgical history and problem list.   Objective:   General:  Alert, oriented and cooperative.   Mental Status: Normal mood and affect perceived. Normal judgment and thought content.  Rest of physical exam deferred due to type of encounter  Assessment and Plan:  Pregnancy: G2P1001 at [redacted]w[redacted]d 1. Supervision of high risk pregnancy, antepartum -BP 120/89. Discussed normal BP and when to call office -Anticipatory guidance of next visit in office reviewed with patient  2. HIV disease affecting pregnancy, antepartum -Patient doing well. Next appointment in July -F/U u/s scheduled 6/24  3. Anemia during pregnancy in third trimester  -Working with PCP and HIV MD to arrange iron transfusion in [place]  4. Group B Streptococcus urinary tract infection affecting pregnancy, antepartum -Will need intrapartum prophylaxis  Preterm labor symptoms and general obstetric precautions including but not limited to vaginal bleeding, contractions, leaking of fluid and fetal movement were reviewed in detail with the patient.  I discussed the assessment and treatment plan with the patient. The patient was provided an opportunity to ask questions and all were answered. The patient agreed with the plan and demonstrated an understanding of the instructions. The patient was advised to call back or seek an in-person office evaluation/go to MAU at [facility] for any urgent or concerning symptoms. Please refer to After Visit Summary for other counseling recommendations.   I provided 10 minutes of non-face-to-face time during this encounter.  Return in about 5 weeks (around 05/31/2019) for Return OB visit.  Future Appointments  Date Time Provider [place]  04/26/2019  9:15 AM Erick Colace [facility] [place]  05/11/2019 10:30 AM [place] NURSE [place] MFC-US  05/11/2019 10:30 AM WH-MFC [place] 1 WH-MFCUS MFC-US    Keri Veale M Lexia Vandevender, [place] for [employer], [place]

## 2019-04-26 NOTE — Progress Notes (Signed)
I connected with  Audry Riles on 04/26/19 at  9:15 AM EDT by telephone and verified that I am speaking with the correct person using two identifiers.   I discussed the limitations, risks, security and privacy concerns of performing an evaluation and management service by telephone and the availability of in person appointments. I also discussed with the patient that there may be a patient responsible charge related to this service. The patient expressed understanding and agreed to proceed.  [place], [place] 04/26/2019  8:51 AM

## 2019-04-27 ENCOUNTER — Encounter: Payer: Self-pay | Admitting: *Deleted

## 2019-05-02 ENCOUNTER — Telehealth (INDEPENDENT_AMBULATORY_CARE_PROVIDER_SITE_OTHER): Payer: Medicaid Other | Admitting: Lactation Services

## 2019-05-02 DIAGNOSIS — O99013 Anemia complicating pregnancy, third trimester: Secondary | ICD-10-CM

## 2019-05-02 NOTE — Telephone Encounter (Signed)
Pt called and reports she was expecting a call from the office to set up an Iron Infusion in [facility]. Pt reports her PCP is [facility] in [place]. Pt would like a call back.   Routed to Clinical Pool for follow up.

## 2019-05-02 NOTE — Telephone Encounter (Signed)
[facility] in [place] called to ask them to schedule Pt for Faraheme infusions in [place] at [employer] request. LM on nurse voice mail to have them schedule the infusions and call us at their convenience.   Called pt at 10:02 to let he know I have left a message for [facility] to schedule her iron infusions. No answer, LM for pt to call the office. Pt did answer her home phone and reports she did have the infusions scheduled previously on June 2 in [place] and she cancelled the appts as she wants to go to [facility]. Pt is aware her PCP's office has been notified to schedule the infusions. Pt to call with further questions/concerns as needed.

## 2019-05-11 ENCOUNTER — Encounter (HOSPITAL_COMMUNITY): Payer: Self-pay | Admitting: *Deleted

## 2019-05-11 ENCOUNTER — Ambulatory Visit (HOSPITAL_COMMUNITY): Payer: Medicaid Other | Admitting: *Deleted

## 2019-05-11 ENCOUNTER — Other Ambulatory Visit: Payer: Self-pay

## 2019-05-11 ENCOUNTER — Ambulatory Visit (HOSPITAL_COMMUNITY)
Admission: RE | Admit: 2019-05-11 | Discharge: 2019-05-11 | Disposition: A | Discharge status: Home / Self Care | Payer: Medicaid Other | Source: Ambulatory Visit | Attending: Obstetrics and Gynecology | Admitting: Obstetrics and Gynecology

## 2019-05-11 ENCOUNTER — Other Ambulatory Visit (HOSPITAL_COMMUNITY): Payer: Self-pay | Admitting: *Deleted

## 2019-05-11 VITALS — Temp 97.7°F

## 2019-05-11 DIAGNOSIS — O99213 Obesity complicating pregnancy, third trimester: Secondary | ICD-10-CM | POA: Diagnosis not present

## 2019-05-11 DIAGNOSIS — O98719 Human immunodeficiency virus [HIV] disease complicating pregnancy, unspecified trimester: Secondary | ICD-10-CM | POA: Diagnosis not present

## 2019-05-11 DIAGNOSIS — Z21 Asymptomatic human immunodeficiency virus [HIV] infection status: Secondary | ICD-10-CM | POA: Insufficient documentation

## 2019-05-11 DIAGNOSIS — O09899 Supervision of other high risk pregnancies, unspecified trimester: Secondary | ICD-10-CM | POA: Diagnosis not present

## 2019-05-11 DIAGNOSIS — O98713 Human immunodeficiency virus [HIV] disease complicating pregnancy, third trimester: Secondary | ICD-10-CM | POA: Diagnosis not present

## 2019-05-11 DIAGNOSIS — Z3A33 33 weeks gestation of pregnancy: Secondary | ICD-10-CM | POA: Diagnosis not present

## 2019-05-11 DIAGNOSIS — Z362 Encounter for other antenatal screening follow-up: Secondary | ICD-10-CM | POA: Diagnosis not present

## 2019-05-23 ENCOUNTER — Telehealth: Payer: Self-pay | Admitting: General Practice

## 2019-05-23 NOTE — Telephone Encounter (Signed)
Patient called and left message on nurse voicemail line stating she is calling about her iron infusion. Patient states her PCP told her to call us- she is 35 weeks.   Called patient and told her we were under the impression her PCP was setting up. Told patient we called them and left them a message to set up her iron infusions in [facility] per her request. Patient states she will call now & follow up with them. Told patient to call us back and let us know if we need to do anything for her. Patient states she will. Patient had no questions.

## 2019-05-31 ENCOUNTER — Encounter: Payer: Medicaid Other | Admitting: Obstetrics and Gynecology

## 2019-05-31 ENCOUNTER — Other Ambulatory Visit: Payer: Self-pay

## 2019-05-31 ENCOUNTER — Telehealth: Payer: Self-pay | Admitting: Medical

## 2019-05-31 DIAGNOSIS — O0990 Supervision of high risk pregnancy, unspecified, unspecified trimester: Secondary | ICD-10-CM

## 2019-05-31 MED ORDER — PROMETHAZINE HCL 25 MG PO TABS: 25.0000 mg | ORAL_TABLET | Freq: Four times a day (QID) | ORAL | 0 refills | Status: DC | PRN

## 2019-05-31 NOTE — Telephone Encounter (Signed)
Called the patient to inform of missed appointment. Left a detailed voicemail regarding rescheduling and also sending a missed appointment letter.

## 2019-05-31 NOTE — Progress Notes (Deleted)
Patient did not keep her appointment today.    Lezlie Lye, NP 05/31/2019 2:46 PM

## 2019-05-31 NOTE — Progress Notes (Signed)
Per protocol refilled pt medication for 1 more refill.  Pt notified and verbalized understanding.

## 2019-06-06 ENCOUNTER — Telehealth (HOSPITAL_COMMUNITY): Payer: Self-pay | Admitting: Lactation Services

## 2019-06-06 NOTE — Telephone Encounter (Signed)
Beth George called [facility] regarding an iron transfusion form that needed to be faxed.  Returned phone call and left message that she had called the wrong number.  Gave her number of clinic and instructed her to listen to the prompts on which to press to talk to someone there.

## 2019-06-07 ENCOUNTER — Ambulatory Visit (INDEPENDENT_AMBULATORY_CARE_PROVIDER_SITE_OTHER): Payer: Medicaid Other | Admitting: Medical

## 2019-06-07 ENCOUNTER — Other Ambulatory Visit: Payer: Self-pay

## 2019-06-07 ENCOUNTER — Encounter: Payer: Self-pay | Admitting: Medical

## 2019-06-07 VITALS — BP 132/85 | HR 111 | Temp 97.9°F | Wt 188.6 lb

## 2019-06-07 DIAGNOSIS — O98713 Human immunodeficiency virus [HIV] disease complicating pregnancy, third trimester: Secondary | ICD-10-CM

## 2019-06-07 DIAGNOSIS — O0990 Supervision of high risk pregnancy, unspecified, unspecified trimester: Secondary | ICD-10-CM

## 2019-06-07 DIAGNOSIS — Z3A37 37 weeks gestation of pregnancy: Secondary | ICD-10-CM

## 2019-06-07 DIAGNOSIS — B951 Streptococcus, group B, as the cause of diseases classified elsewhere: Secondary | ICD-10-CM

## 2019-06-07 DIAGNOSIS — O2340 Unspecified infection of urinary tract in pregnancy, unspecified trimester: Secondary | ICD-10-CM

## 2019-06-07 DIAGNOSIS — O2343 Unspecified infection of urinary tract in pregnancy, third trimester: Secondary | ICD-10-CM

## 2019-06-07 DIAGNOSIS — O98719 Human immunodeficiency virus [HIV] disease complicating pregnancy, unspecified trimester: Secondary | ICD-10-CM

## 2019-06-07 DIAGNOSIS — O99013 Anemia complicating pregnancy, third trimester: Secondary | ICD-10-CM

## 2019-06-07 DIAGNOSIS — O0993 Supervision of high risk pregnancy, unspecified, third trimester: Secondary | ICD-10-CM

## 2019-06-07 NOTE — Patient Instructions (Signed)
[facility]  [address], [place], [place] [id number] Entrance C off of [address]    Fetal Movement Counts Patient Name: ________________________________________________ Patient Due Date: ____________________ What is a fetal movement count?  A fetal movement count is the number of times that you feel your baby move during a certain amount of time. This may also be called a fetal kick count. A fetal movement count is recommended for every pregnant woman. You may be asked to start counting fetal movements as early as week 28 of your pregnancy. Pay attention to when your baby is most active. You may notice your baby's sleep and wake cycles. You may also notice things that make your baby move more. You should do a fetal movement count:  When your baby is normally most active.  At the same time each day. A good time to count movements is while you are resting, after having something to eat and drink. How do I count fetal movements? 1. Find a quiet, comfortable area. Sit, or lie down on your side. 2. Write down the date, the start time and stop time, and the number of movements that you felt between those two times. Take this information with you to your health care visits. 3. For 2 hours, count kicks, flutters, swishes, rolls, and jabs. You should feel at least 10 movements during 2 hours. 4. You may stop counting after you have felt 10 movements. 5. If you do not feel 10 movements in 2 hours, have something to eat and drink. Then, keep resting and counting for 1 hour. If you feel at least 4 movements during that hour, you may stop counting. Contact a health care provider if:  You feel fewer than 4 movements in 2 hours.  Your baby is not moving like he or she usually does. Date: ____________ Start time: ____________ Stop time: ____________ Movements: ____________ Date: ____________ Start time: ____________ Stop time: ____________ Movements:  ____________ Date: ____________ Start time: ____________ Stop time: ____________ Movements: ____________ Date: ____________ Start time: ____________ Stop time: ____________ Movements: ____________ Date: ____________ Start time: ____________ Stop time: ____________ Movements: ____________ Date: ____________ Start time: ____________ Stop time: ____________ Movements: ____________ Date: ____________ Start time: ____________ Stop time: ____________ Movements: ____________ Date: ____________ Start time: ____________ Stop time: ____________ Movements: ____________ Date: ____________ Start time: ____________ Stop time: ____________ Movements: ____________ This information is not intended to replace advice given to you by your health care provider. Make sure you discuss any questions you have with your health care provider. Document Released: 12/03/2006 Document Revised: 11/23/2018 Document Reviewed: 12/13/2015 Elsevier Patient Education  2020 Elsevier Inc.  [employer] CorporationBraxton Hicks Contractions Contractions of the uterus can occur throughout pregnancy, but they are not always a sign that you are in labor. You may have practice contractions called Braxton Hicks contractions. These false labor contractions are sometimes confused with true labor. What are Deberah PeltonBraxton Hicks contractions? Braxton Hicks contractions are tightening movements that occur in the muscles of the uterus before labor. Unlike true labor contractions, these contractions do not result in opening (dilation) and thinning of the cervix. Toward the end of pregnancy (32-34 weeks), Braxton Hicks contractions can happen more often and may become stronger. These contractions are sometimes difficult to tell apart from true labor because they can be very uncomfortable. You should not feel embarrassed if you go to the hospital with false labor. Sometimes, the only way to tell if you are in true labor is for  your health care provider to look for changes in the  cervix. The health care provider will do a physical exam and may monitor your contractions. If you are not in true labor, the exam should show that your cervix is not dilating and your water has not broken. If there are no other health problems associated with your pregnancy, it is completely safe for you to be sent home with false labor. You may continue to have Braxton Hicks contractions until you go into true labor. How to tell the difference between true labor and false labor True labor  Contractions last 30-70 seconds.  Contractions become very regular.  Discomfort is usually felt in the top of the uterus, and it spreads to the lower abdomen and low back.  Contractions do not go away with walking.  Contractions usually become more intense and increase in frequency.  The cervix dilates and gets thinner. False labor  Contractions are usually shorter and not as strong as true labor contractions.  Contractions are usually irregular.  Contractions are often felt in the front of the lower abdomen and in the groin.  Contractions may go away when you walk around or change positions while lying down.  Contractions get weaker and are shorter-lasting as time goes on.  The cervix usually does not dilate or become thin. Follow these instructions at home:   Take over-the-counter and prescription medicines only as told by your health care provider.  Keep up with your usual exercises and follow other instructions from your health care provider.  Eat and drink lightly if you think you are going into labor.  If Braxton Hicks contractions are making you uncomfortable: ? Change your position from lying down or resting to walking, or change from walking to resting. ? Sit and rest in a tub of warm water. ? Drink enough fluid to keep your urine pale yellow. Dehydration may cause these contractions. ? Do slow and deep breathing several times an hour.  Keep all follow-up prenatal visits as  told by your health care provider. This is important. Contact a health care provider if:  You have a fever.  You have continuous pain in your abdomen. Get help right away if:  Your contractions become stronger, more regular, and closer together.  You have fluid leaking or gushing from your vagina.  You pass blood-tinged mucus (bloody show).  You have bleeding from your vagina.  You have low back pain that you never had before.  You feel your baby's head pushing down and causing pelvic pressure.  Your baby is not moving inside you as much as it used to. Summary  Contractions that occur before labor are called Braxton Hicks contractions, false labor, or practice contractions.  Braxton Hicks contractions are usually shorter, weaker, farther apart, and less regular than true labor contractions. True labor contractions usually become progressively stronger and regular, and they become more frequent.  Manage discomfort from [facility] contractions by changing position, resting in a warm bath, drinking plenty of water, or practicing deep breathing. This information is not intended to replace advice given to you by your health care provider. Make sure you discuss any questions you have with your health care provider. Document Released: 03/19/2017 Document Revised: 10/16/2017 Document Reviewed: 03/19/2017 Elsevier Patient Education  2020 [employer].

## 2019-06-07 NOTE — Progress Notes (Signed)
   PRENATAL VISIT NOTE  Subjective:  Beth George is a 23 y.o. G2P1001 at [redacted]w[redacted]d being seen today for ongoing prenatal care.  She is currently monitored for the following issues for this high-risk pregnancy and has HIV disease affecting pregnancy; Anemia in pregnancy; Supervision of high risk pregnancy, antepartum; Group B streptococcus urinary tract infection affecting pregnancy; and Trichomonal vaginitis in pregnancy on their problem list.  Patient reports no complaints.  Contractions: Irritability. Vag. Bleeding: None.  Movement: Present. Denies leaking of fluid.   The following portions of the patient's history were reviewed and updated as appropriate: allergies, current medications, past family history, past medical history, past social history, past surgical history and problem list.   Objective:   Vitals:   06/07/19 1612  BP: 132/85  Pulse: (!) 111  Temp: 97.9 F (36.6 C)  Weight: 188 lb 9.6 oz (85.5 kg)    Fetal Status: Fetal Heart Rate (bpm): 143 Fundal Height: 37 cm Movement: Present     General:  Alert, oriented and cooperative. Patient is in no acute distress.  Skin: Skin is warm and dry. No rash noted.   Cardiovascular: Normal heart rate noted  Respiratory: Normal respiratory effort, no problems with respiration noted  Abdomen: Soft, gravid, appropriate for gestational age.  Pain/Pressure: Present     Pelvic: Cervical exam deferred        Extremities: Normal range of motion.  Edema: Trace  Mental Status: Normal mood and affect. Normal behavior. Normal judgment and thought content.   Assessment and Plan:  Pregnancy: G2P1001 at [redacted]w[redacted]d 1. Supervision of high risk pregnancy, antepartum - Doing well  2. Anemia during pregnancy in third trimester - Order for [facility] given to patient to take to PCP at patient's request since she lives in [facility] and wants to have it done closer to home   3. HIV disease affecting pregnancy, antepartum  4. Group B  Streptococcus urinary tract infection affecting pregnancy, antepartum - Treat in labor, patient aware   Term labor symptoms and general obstetric precautions including but not limited to vaginal bleeding, contractions, leaking of fluid and fetal movement were reviewed in detail with the patient. Please refer to After Visit Summary for other counseling recommendations.   Return in about 1 week (around 06/14/2019) for LOB, Virtual.  Future Appointments  Date Time Provider [place]  06/09/2019 10:30 AM [place] [facility]  06/09/2019 10:30 AM [facility] [place] 1 [facility]    Kerry Hough, PA-C

## 2019-06-08 ENCOUNTER — Telehealth: Payer: Self-pay | Admitting: General Practice

## 2019-06-08 NOTE — Telephone Encounter (Signed)
Patient called and left message on nurse voicemail line stating she saw [place] yesterday for an appt and was given a prescription for an iron infusion to give to her PCP. Patient states her doctor is still requesting a most recent office note in order to schedule that. Patient requests it be faxed to [phone].  Called patient at home number and she states her PCP is still requesting an office note prior to scheduling infusion. Asked patient the name of her PCP's office and she states [facility] in [facility] and confirmed fax number [phone]. Told patient I would let our front office staff know so records could be faxed. Patient verbalized understanding & had no questions.

## 2019-06-09 ENCOUNTER — Ambulatory Visit (HOSPITAL_COMMUNITY): Payer: Medicaid Other

## 2019-06-09 ENCOUNTER — Encounter (HOSPITAL_COMMUNITY): Payer: Self-pay

## 2019-06-09 ENCOUNTER — Ambulatory Visit (HOSPITAL_COMMUNITY)
Admission: RE | Admit: 2019-06-09 | Discharge: 2019-06-09 | Disposition: A | Discharge status: Home / Self Care | Payer: Medicaid Other | Source: Ambulatory Visit | Attending: Obstetrics and Gynecology | Admitting: Obstetrics and Gynecology

## 2019-06-09 NOTE — Telephone Encounter (Signed)
Opened in error

## 2019-06-13 LAB — CULTURE, BETA STREP (GROUP B ONLY)

## 2019-06-14 ENCOUNTER — Telehealth (INDEPENDENT_AMBULATORY_CARE_PROVIDER_SITE_OTHER): Payer: Medicaid Other | Admitting: Advanced Practice Midwife

## 2019-06-14 ENCOUNTER — Other Ambulatory Visit: Payer: Self-pay

## 2019-06-14 ENCOUNTER — Encounter: Payer: Self-pay | Admitting: Advanced Practice Midwife

## 2019-06-14 VITALS — BP 135/80 | HR 107

## 2019-06-14 DIAGNOSIS — O0993 Supervision of high risk pregnancy, unspecified, third trimester: Secondary | ICD-10-CM | POA: Diagnosis not present

## 2019-06-14 DIAGNOSIS — O0990 Supervision of high risk pregnancy, unspecified, unspecified trimester: Secondary | ICD-10-CM

## 2019-06-14 DIAGNOSIS — O98713 Human immunodeficiency virus [HIV] disease complicating pregnancy, third trimester: Secondary | ICD-10-CM | POA: Diagnosis not present

## 2019-06-14 DIAGNOSIS — Z3A38 38 weeks gestation of pregnancy: Secondary | ICD-10-CM

## 2019-06-14 DIAGNOSIS — O98719 Human immunodeficiency virus [HIV] disease complicating pregnancy, unspecified trimester: Secondary | ICD-10-CM

## 2019-06-14 NOTE — Progress Notes (Signed)
Pt states sometimes when she wipes, she see's a little blood.

## 2019-06-14 NOTE — Progress Notes (Signed)
   TELEHEALTH VIRTUAL OBSTETRICS VISIT ENCOUNTER NOTE  I connected with Audry Riles on 06/14/19 at  8:15 AM EDT by telephone at home and verified that I am speaking with the correct person using two identifiers.   I discussed the limitations, risks, security and privacy concerns of performing an evaluation and management service by telephone and the availability of in person appointments. I also discussed with the patient that there may be a patient responsible charge related to this service. The patient expressed understanding and agreed to proceed.  Subjective:  Beth George is a 23 y.o. G2P1001 at [redacted]w[redacted]d being followed for ongoing prenatal care.  She is currently monitored for the following issues for this high-risk pregnancy and has HIV disease affecting pregnancy; Anemia in pregnancy; Supervision of high risk pregnancy, antepartum; Group B streptococcus urinary tract infection affecting pregnancy; and Trichomonal vaginitis in pregnancy on their problem list.  Patient reports no complaints. Reports fetal movement. Denies any contractions, bleeding or leaking of fluid.   The following portions of the patient's history were reviewed and updated as appropriate: allergies, current medications, past family history, past medical history, past social history, past surgical history and problem list.   Objective:   General:  Alert, oriented and cooperative.   Mental Status: Normal mood and affect perceived. Normal judgment and thought content.  Rest of physical exam deferred due to type of encounter  Assessment and Plan:  Pregnancy: G2P1001 at [redacted]w[redacted]d 1. Supervision of high risk pregnancy, antepartum - routine care - taking BP daily  - Patient has been unable to have iron infusion scheduled.  2. HIV disease affecting pregnancy, antepartum - Patient sees ID doctor in [place]. Reports undetectable viral load at last visit with them. She is taking medications as prescribed.  Term labor  symptoms and general obstetric precautions including but not limited to vaginal bleeding, contractions, leaking of fluid and fetal movement were reviewed in detail with the patient.  I discussed the assessment and treatment plan with the patient. The patient was provided an opportunity to ask questions and all were answered. The patient agreed with the plan and demonstrated an understanding of the instructions. The patient was advised to call back or seek an in-person office evaluation/go to [facility] at [facility] for any urgent or concerning symptoms. Please refer to After Visit Summary for other counseling recommendations.   I provided 15 minutes of non-face-to-face time during this encounter.  Return in about 1 week (around 06/21/2019).  Future Appointments  Date Time Provider [place]  06/21/2019  8:15 AM Erick Colace [facility] [place]  06/27/2019  8:15 AM [facility]OC  06/27/2019  9:15 AM Lajean Manes, CNM [facility] [place] DNP, CNM  06/14/19  8:39 AM  [facility]

## 2019-06-16 DIAGNOSIS — Z21 Asymptomatic human immunodeficiency virus [HIV] infection status: Secondary | ICD-10-CM | POA: Diagnosis not present

## 2019-06-16 DIAGNOSIS — O9872 Human immunodeficiency virus [HIV] disease complicating childbirth: Secondary | ICD-10-CM | POA: Diagnosis not present

## 2019-06-16 DIAGNOSIS — Z3A39 39 weeks gestation of pregnancy: Secondary | ICD-10-CM | POA: Diagnosis not present

## 2019-06-16 DIAGNOSIS — B951 Streptococcus, group B, as the cause of diseases classified elsewhere: Secondary | ICD-10-CM | POA: Diagnosis not present

## 2019-06-16 DIAGNOSIS — O479 False labor, unspecified: Secondary | ICD-10-CM | POA: Diagnosis not present

## 2019-06-16 DIAGNOSIS — O99824 Streptococcus B carrier state complicating childbirth: Secondary | ICD-10-CM | POA: Diagnosis not present

## 2019-06-21 ENCOUNTER — Telehealth: Payer: Self-pay

## 2019-06-21 ENCOUNTER — Other Ambulatory Visit: Payer: Self-pay

## 2019-06-21 ENCOUNTER — Telehealth: Payer: Medicaid Other

## 2019-06-21 DIAGNOSIS — Z5329 Procedure and treatment not carried out because of patient's decision for other reasons: Secondary | ICD-10-CM

## 2019-06-21 DIAGNOSIS — Z91199 Patient's noncompliance with other medical treatment and regimen due to unspecified reason: Secondary | ICD-10-CM

## 2019-06-21 NOTE — Progress Notes (Signed)
8:20a- Called pt for My Chart visit, no answer, left VM that will retry in 10 minutes.    8:31a- 2nd attempt, no answer, left VM that she needs to reschedule.

## 2019-06-21 NOTE — Telephone Encounter (Signed)
Attempted to call patient about her missed appointment, and that I was able to get her back on the schedule for 08/05. I left a detailed voicemail.

## 2019-06-21 NOTE — Progress Notes (Signed)
Unable to reach patient for virtual visit. Will reschedule.  Wende Mott, CNM 06/21/19 8:35 AM

## 2019-06-22 ENCOUNTER — Telehealth: Payer: Medicaid Other | Admitting: Advanced Practice Midwife

## 2019-06-22 ENCOUNTER — Other Ambulatory Visit: Payer: Self-pay

## 2019-06-22 DIAGNOSIS — Z91199 Patient's noncompliance with other medical treatment and regimen due to unspecified reason: Secondary | ICD-10-CM

## 2019-06-22 DIAGNOSIS — Z5329 Procedure and treatment not carried out because of patient's decision for other reasons: Secondary | ICD-10-CM

## 2019-06-22 NOTE — Progress Notes (Signed)
10:29a- Called pt for My Chart visit, no answer, left VM that will call back in 10 min.     10:45A-2nd attempt, no answer left VM that we will need to reschedule.

## 2019-06-27 ENCOUNTER — Encounter: Payer: Self-pay | Admitting: Family Medicine

## 2019-06-27 ENCOUNTER — Encounter: Payer: Medicaid Other | Admitting: Family Medicine

## 2019-06-27 ENCOUNTER — Encounter: Payer: Medicaid Other | Admitting: Certified Nurse Midwife

## 2019-06-27 ENCOUNTER — Other Ambulatory Visit: Payer: Medicaid Other

## 2019-06-27 ENCOUNTER — Telehealth: Payer: Self-pay | Admitting: Obstetrics and Gynecology

## 2019-06-27 NOTE — Telephone Encounter (Signed)
Called the patient to inform of missed appointment. Left a voicemail stating please return the call to our office, also mailing a missed appointment letter.

## 2019-06-27 NOTE — Progress Notes (Signed)
Patient did not keep appointment today. She will be called to reschedule.  

## 2019-09-08 DIAGNOSIS — B20 Human immunodeficiency virus [HIV] disease: Secondary | ICD-10-CM

## 2019-09-15 ENCOUNTER — Encounter (HOSPITAL_COMMUNITY): Payer: Self-pay

## 2019-10-08 IMAGING — US US MFM OB FOLLOW UP
1 series · 13 of 28 positions shown · non-contrast
Comparison: none

[Series 1: us mfm ob follow up · 59 acquisitions, 13 frames shown]
[im 3/59]
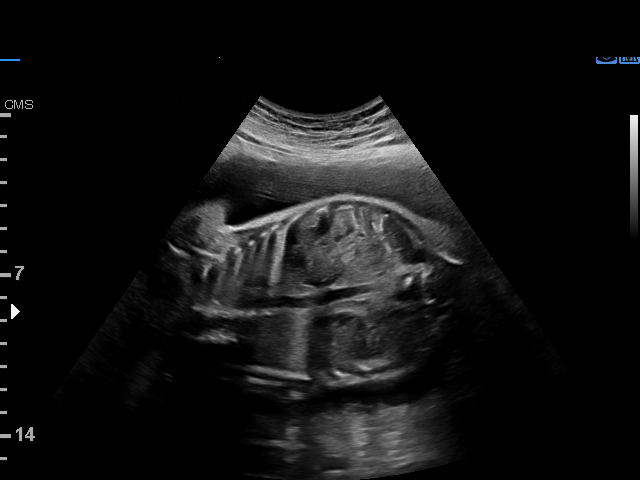
[im 7/59]
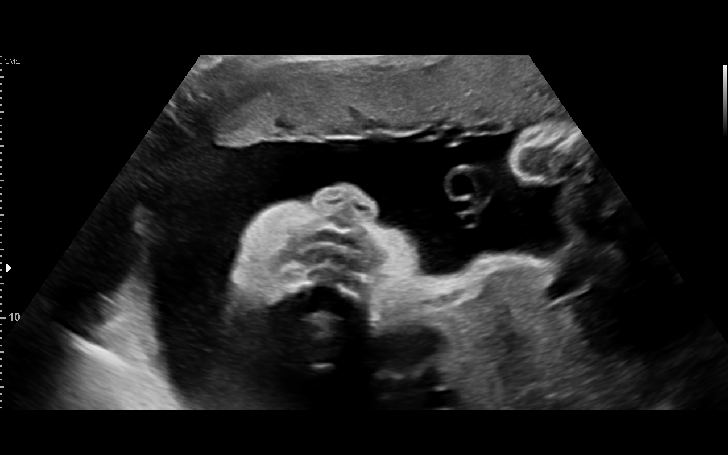
[im 11/59]
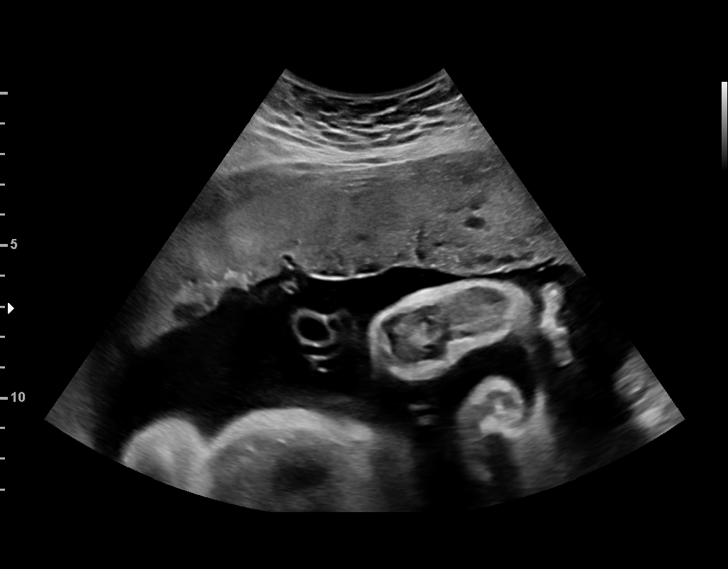
[im 16/59]
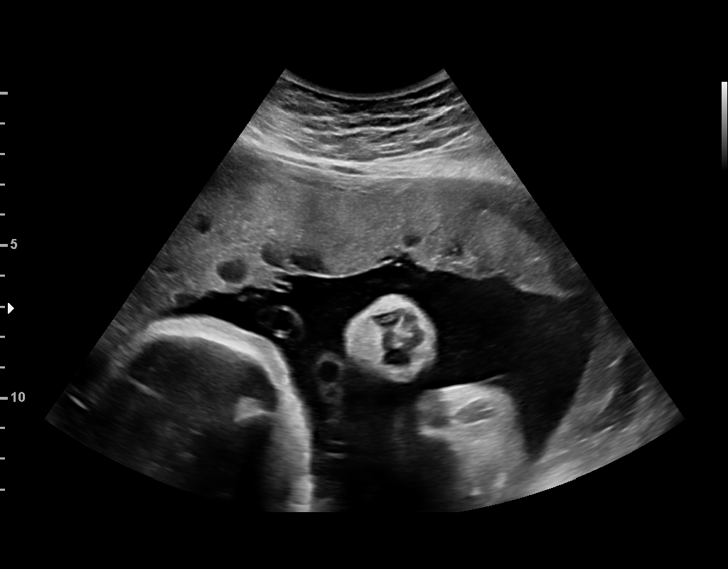
[im 20/59]
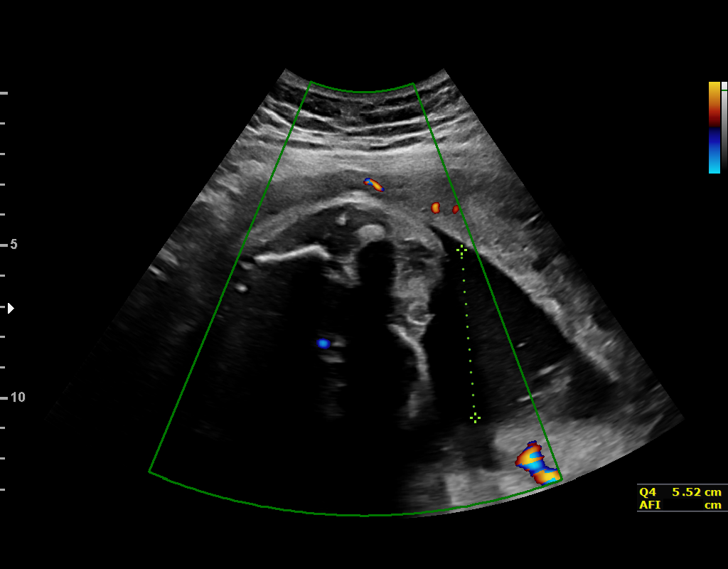
[im 24/59]
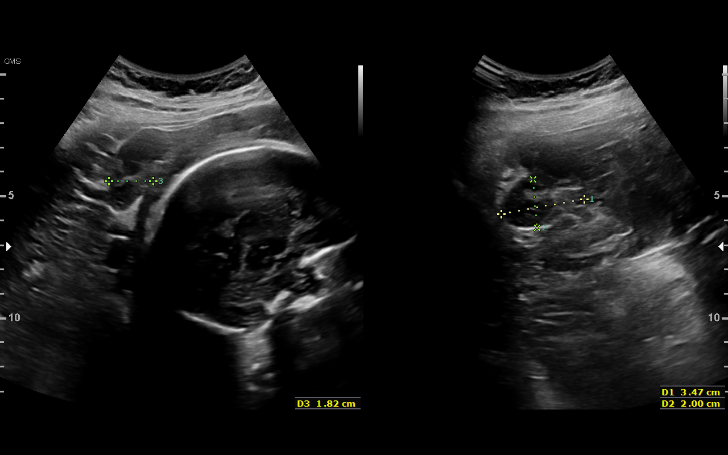
[im 31/59]
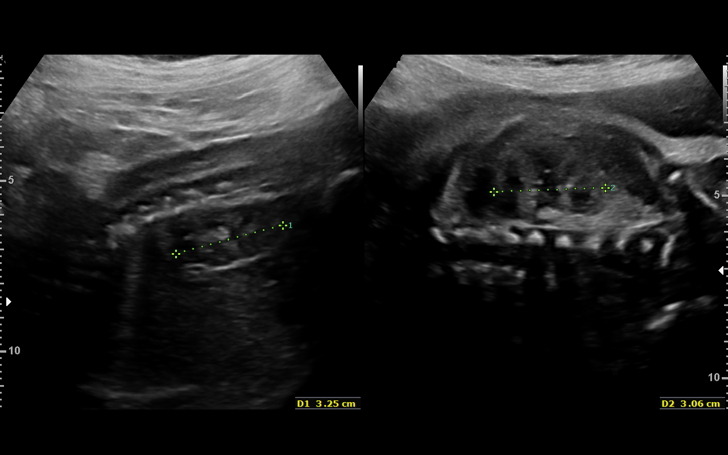
[im 35/59]
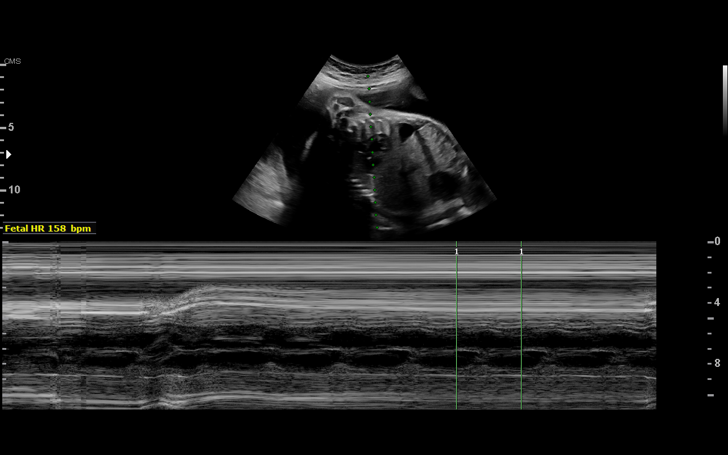
[im 39/59]
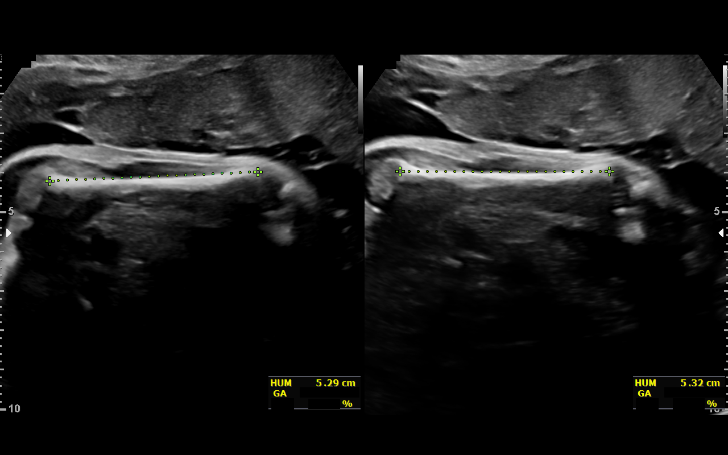
[im 43/59]
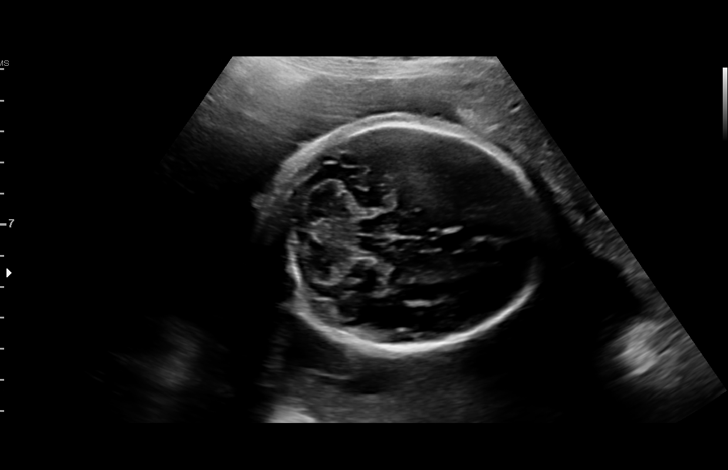
[im 48/59]
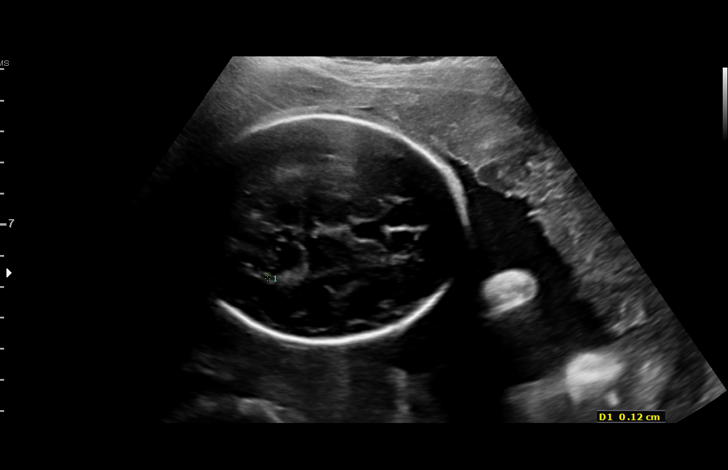
[im 52/59]
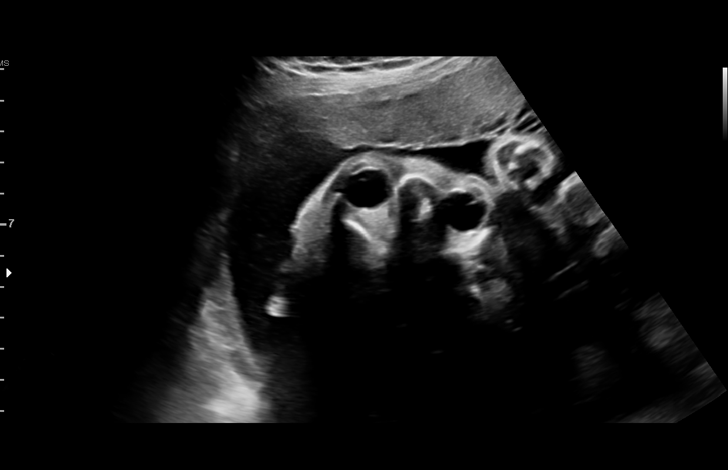
[im 56/59]
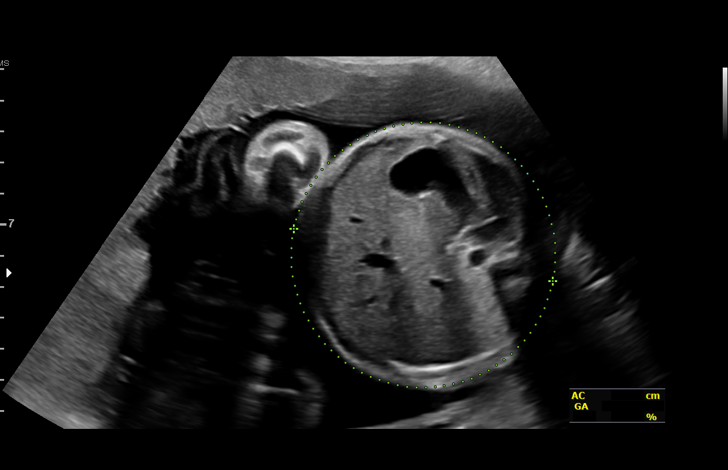

[13 of 28 positions shown; findings below may reference images not displayed]

[address]

                                                       KLEE
 ----------------------------------------------------------------------

 ----------------------------------------------------------------------
Indications

  Encounter for other antenatal screening
  follow-up
  Obesity complicating pregnancy, third
  trimester (pregravid BMI 32)
  HIV affecting pregnancy, third trimester
  29 weeks gestation of pregnancy
 ----------------------------------------------------------------------
Vital Signs

                                                Height:        5'2"
Fetal Evaluation

 Num Of Fetuses:         1
 Fetal Heart Rate(bpm):  158
 Cardiac Activity:       Observed
 Presentation:           Transverse, head to maternal right
 Placenta:               Anterior
 P. Cord Insertion:      Visualized

 Amniotic Fluid
 AFI FV:      Within normal limits

 AFI Sum(cm)     %Tile       Largest Pocket(cm)
 16.72           61

 RUQ(cm)       RLQ(cm)       LUQ(cm)        LLQ(cm)

Biometry

 BPD:      73.7  mm     G. Age:  29w 4d         29  %    CI:        73.99   %    70 - 86
                                                         FL/HC:      21.1   %    19.2 -
 HC:      272.1  mm     G. Age:  29w 5d         14  %    HC/AC:      1.02        0.99 -
 AC:      267.8  mm     G. Age:  30w 6d         75  %    FL/BPD:     77.7   %    71 - 87
 FL:       57.3  mm     G. Age:  30w 0d         40  %    FL/AC:      21.4   %    20 - 24
 HUM:      52.9  mm     G. Age:  30w 6d         69  %

 Est. FW:    9085  gm      3 lb 7 oz     63  %
OB History

 Gravidity:    2         Term:   1        Prem:   0        SAB:   0
 TOP:          0       Ectopic:  0        Living: 1
Gestational Age

 LMP:           32w 5d        Date:  08/27/18                 EDD:   06/03/19
 U/S Today:     30w 0d                                        EDD:   06/22/19
 Best:          29w 6d     Det. By:  U/S  (01/05/19)          EDD:   06/23/19
Anatomy

 Cranium:               Appears normal         Aortic Arch:            Previously seen
 Cavum:                 Appears normal         Ductal Arch:            Previously seen
 Ventricles:            Appears normal         Diaphragm:              Appears normal
 Choroid Plexus:        Appears normal         Stomach:                Appears normal, left
                                                                       sided
 Cerebellum:            Appears normal         Abdomen:                Appears normal
 Posterior Fossa:       Previously seen        Abdominal Wall:         Appears nml (cord
                                                                       insert, abd wall)
 Nuchal Fold:           Not applicable (>20    Cord Vessels:           Appears normal (3
                        wks GA)                                        vessel cord)
 Face:                  Appears normal         Kidneys:                Appear normal
                        (orbits and profile)
 Lips:                  Appears normal         Bladder:                Appears normal
 Thoracic:              Appears normal         Spine:                  Previously seen
 Heart:                 Appears normal         Upper Extremities:      Previously seen
                        (4CH, axis, and
                        situs)
 RVOT:                  Previously seen        Lower Extremities:      Previously seen
 LVOT:                  Appears normal

 Other:  Heels and 5th digit previously visualized.
Cervix Uterus Adnexa

 Cervix
 Normal appearance by transabdominal scan.

 Uterus
 No abnormality visualized.

 Left Ovary
 Within normal limits.

 Right Ovary
 Within normal limits.

 Cul De Sac
 No free fluid seen.

 Adnexa
 No abnormality visualized.
Impression

 Normal interval growth.
 HIV+
 BMI 32
Recommendations

 Follow up growth in 4 weeks scheduled today.

## 2020-02-06 ENCOUNTER — Encounter: Payer: Self-pay | Admitting: Infectious Diseases

## 2020-03-08 ENCOUNTER — Ambulatory Visit: Payer: Medicaid Other | Admitting: Infectious Diseases

## 2020-03-29 ENCOUNTER — Ambulatory Visit (INDEPENDENT_AMBULATORY_CARE_PROVIDER_SITE_OTHER): Payer: Medicaid Other | Admitting: Infectious Diseases

## 2020-03-29 ENCOUNTER — Other Ambulatory Visit: Payer: Self-pay

## 2020-03-29 ENCOUNTER — Encounter: Payer: Self-pay | Admitting: Infectious Diseases

## 2020-03-29 VITALS — BP 121/82 | HR 83 | Temp 98.4°F | Ht 62.0 in | Wt 194.0 lb

## 2020-03-29 DIAGNOSIS — Z113 Encounter for screening for infections with a predominantly sexual mode of transmission: Secondary | ICD-10-CM

## 2020-03-29 DIAGNOSIS — Z79899 Other long term (current) drug therapy: Secondary | ICD-10-CM | POA: Diagnosis not present

## 2020-03-29 DIAGNOSIS — B20 Human immunodeficiency virus [HIV] disease: Secondary | ICD-10-CM

## 2020-03-29 DIAGNOSIS — O99019 Anemia complicating pregnancy, unspecified trimester: Secondary | ICD-10-CM

## 2020-03-29 NOTE — Progress Notes (Signed)
   Subjective:    Patient ID: Beth George, female    DOB: [birth year], 24 y.o.   MRN: [id number]  HPI 24 yo F HIV+ since 09-2008.  She is on biktarvy, her only rx except for ISN-TRV when she was pregnant.  She delivered HIV- girl 05-2019.  Has not had her anemia checked since she delivered.  CD4 655 and HIV RNA 11,500 (08-2019).  The past medical history, family history and social history were reviewed/updated in EPIC  Review of Systems  Constitutional: Positive for appetite change (qday). Negative for unexpected weight change.  Respiratory: Negative for cough and shortness of breath.   Gastrointestinal: Negative for blood in stool and constipation.  Genitourinary: Negative for difficulty urinating and menstrual problem.  pap at last pregnancy.  Please see HPI. All other systems reviewed and negative.      Objective:   Physical Exam Vitals reviewed.  Constitutional:      Appearance: Normal appearance. She is obese.  HENT:     Mouth/Throat:     Mouth: Mucous membranes are moist.     Pharynx: No oropharyngeal exudate.  Eyes:     Extraocular Movements: Extraocular movements intact.     Pupils: Pupils are equal, round, and reactive to light.  Cardiovascular:     Rate and Rhythm: Normal rate and regular rhythm.  Pulmonary:     Effort: Pulmonary effort is normal.     Breath sounds: Normal breath sounds.  Abdominal:     General: Bowel sounds are normal. There is no distension.     Palpations: Abdomen is soft.     Tenderness: There is no abdominal tenderness.  Musculoskeletal:        General: Normal range of motion.     Cervical back: Normal range of motion and neck supple.     Right lower leg: No edema.     Left lower leg: No edema.  Neurological:     General: No focal deficit present.     Mental Status: She is alert.  Psychiatric:        Mood and Affect: Mood normal.           Assessment & Plan:

## 2020-03-29 NOTE — Assessment & Plan Note (Signed)
Will re-check her h/h today

## 2020-03-29 NOTE — Assessment & Plan Note (Signed)
She appears to be doing well.  Will continue her biktarvy She is given condoms She does not want further kids (has 2) Will set her up for pap Will give her info for COVID vax.  rtc in 6 months.

## 2020-04-09 LAB — COMPREHENSIVE METABOLIC PANEL
AG Ratio: 1 (calc) (ref 1.0–2.5)
ALT: 8 U/L (ref 6–29)
AST: 12 U/L (ref 10–30)
Albumin: 4 g/dL (ref 3.6–5.1)
Alkaline phosphatase (APISO): 63 U/L (ref 31–125)
BUN/Creatinine Ratio: 5 (calc) — ABNORMAL LOW (ref 6–22)
BUN: 4 mg/dL — ABNORMAL LOW (ref 7–25)
CO2: 27 mmol/L (ref 20–32)
Calcium: 9.1 mg/dL (ref 8.6–10.2)
Chloride: 104 mmol/L (ref 98–110)
Creat: 0.74 mg/dL (ref 0.50–1.10)
Globulin: 3.9 g/dL (calc) — ABNORMAL HIGH (ref 1.9–3.7)
Glucose, Bld: 95 mg/dL (ref 65–99)
Potassium: 3.9 mmol/L (ref 3.5–5.3)
Sodium: 137 mmol/L (ref 135–146)
Total Bilirubin: 0.2 mg/dL (ref 0.2–1.2)
Total Protein: 7.9 g/dL (ref 6.1–8.1)

## 2020-04-09 LAB — CBC
HCT: 32.1 % — ABNORMAL LOW (ref 35.0–45.0)
Hemoglobin: 10.1 g/dL — ABNORMAL LOW (ref 11.7–15.5)
MCH: 25.1 pg — ABNORMAL LOW (ref 27.0–33.0)
MCHC: 31.5 g/dL — ABNORMAL LOW (ref 32.0–36.0)
MCV: 79.7 fL — ABNORMAL LOW (ref 80.0–100.0)
MPV: 10.2 fL (ref 7.5–12.5)
Platelets: 416 10*3/uL — ABNORMAL HIGH (ref 140–400)
RBC: 4.03 10*6/uL (ref 3.80–5.10)
RDW: 16 % — ABNORMAL HIGH (ref 11.0–15.0)
WBC: 6.9 10*3/uL (ref 3.8–10.8)

## 2020-04-09 LAB — HIV-1 RNA ULTRAQUANT REFLEX TO GENTYP+
HIV 1 RNA Quant: <20 copies/mL
HIV-1 RNA Quant, Log: <1.3 Log copies/mL

## 2020-04-09 LAB — HIV-1 INTEGRASE GENOTYPE

## 2020-04-09 LAB — RPR: RPR Ser Ql: NONREACTIVE

## 2020-07-20 DIAGNOSIS — J069 Acute upper respiratory infection, unspecified: Secondary | ICD-10-CM | POA: Diagnosis not present

## 2020-07-20 DIAGNOSIS — Z20828 Contact with and (suspected) exposure to other viral communicable diseases: Secondary | ICD-10-CM | POA: Diagnosis not present

## 2020-09-24 ENCOUNTER — Other Ambulatory Visit: Payer: Self-pay

## 2020-09-24 MED ORDER — BIKTARVY 50-200-25 MG PO TABS: 1.0000 | ORAL_TABLET | Freq: Every day | ORAL | 0 refills | Status: DC

## 2020-09-24 NOTE — Telephone Encounter (Signed)
Patient call [facility] requesting medication refill. Patient states she has been out for two days. Refill approved, patient reminded of upcoming appointment with Dr. Ninetta Lights. Valarie Cones

## 2020-10-05 ENCOUNTER — Ambulatory Visit: Payer: Medicaid Other | Admitting: Infectious Diseases

## 2020-10-09 ENCOUNTER — Encounter: Payer: Medicaid Other | Admitting: Infectious Diseases

## 2020-10-11 ENCOUNTER — Other Ambulatory Visit: Payer: Self-pay | Admitting: Infectious Diseases

## 2020-10-16 NOTE — Progress Notes (Signed)
This encounter was created in error - please disregard.

## 2020-12-06 ENCOUNTER — Telehealth: Payer: Self-pay

## 2020-12-06 DIAGNOSIS — B20 Human immunodeficiency virus [HIV] disease: Secondary | ICD-10-CM

## 2020-12-06 MED ORDER — BIKTARVY 50-200-25 MG PO TABS: 1.0000 | ORAL_TABLET | Freq: Every day | ORAL | 0 refills | Status: DC

## 2020-12-06 NOTE — Telephone Encounter (Signed)
Received call from patient, states she needs a follow-up appointment and that she has been out of Biktarvy since 10/22/20. Scheduled with Dr. Ninetta Lights for 12/13/20 with labs same day. RN will send in 30 ay supply of Biktarvy to get her until her appointment.   Sandie Ano, RN

## 2020-12-13 ENCOUNTER — Ambulatory Visit: Payer: Medicaid Other | Admitting: Infectious Diseases

## 2020-12-18 ENCOUNTER — Ambulatory Visit: Payer: Medicaid Other | Admitting: Infectious Diseases

## 2021-01-18 ENCOUNTER — Other Ambulatory Visit: Payer: Self-pay | Admitting: Infectious Diseases

## 2021-01-18 ENCOUNTER — Telehealth: Payer: Self-pay

## 2021-01-18 DIAGNOSIS — B20 Human immunodeficiency virus [HIV] disease: Secondary | ICD-10-CM

## 2021-01-18 NOTE — Telephone Encounter (Signed)
Denied electronic refill request for [employer] per [employer]. View appt history, patient must be seen for OV to discuss any refills.

## 2021-05-02 ENCOUNTER — Ambulatory Visit: Payer: Medicaid Other | Admitting: Infectious Diseases

## 2021-05-28 ENCOUNTER — Ambulatory Visit: Payer: Medicaid Other | Admitting: Infectious Diseases

## 2021-05-28 ENCOUNTER — Other Ambulatory Visit: Payer: Self-pay

## 2021-05-28 ENCOUNTER — Ambulatory Visit: Payer: Medicaid Other

## 2021-05-30 ENCOUNTER — Ambulatory Visit: Payer: Medicaid Other | Admitting: Infectious Diseases

## 2021-06-20 ENCOUNTER — Ambulatory Visit: Payer: Medicaid Other | Admitting: Infectious Diseases

## 2021-06-20 ENCOUNTER — Other Ambulatory Visit: Payer: Self-pay

## 2021-06-20 ENCOUNTER — Encounter: Payer: Self-pay | Admitting: Infectious Diseases

## 2021-06-20 ENCOUNTER — Ambulatory Visit (INDEPENDENT_AMBULATORY_CARE_PROVIDER_SITE_OTHER): Payer: Medicaid Other | Admitting: Infectious Diseases

## 2021-06-20 VITALS — BP 133/87 | HR 86 | Temp 98.0°F | Wt 194.0 lb

## 2021-06-20 DIAGNOSIS — Z113 Encounter for screening for infections with a predominantly sexual mode of transmission: Secondary | ICD-10-CM | POA: Diagnosis not present

## 2021-06-20 DIAGNOSIS — B20 Human immunodeficiency virus [HIV] disease: Secondary | ICD-10-CM | POA: Diagnosis not present

## 2021-06-20 DIAGNOSIS — E669 Obesity, unspecified: Secondary | ICD-10-CM | POA: Insufficient documentation

## 2021-06-20 DIAGNOSIS — R600 Localized edema: Secondary | ICD-10-CM

## 2021-06-20 DIAGNOSIS — Z79899 Other long term (current) drug therapy: Secondary | ICD-10-CM

## 2021-06-20 MED ORDER — BIKTARVY 50-200-25 MG PO TABS: 1.0000 | ORAL_TABLET | Freq: Every day | ORAL | 3 refills | Status: DC

## 2021-06-20 NOTE — Assessment & Plan Note (Signed)
Encouraged wt loss for LE edema control.

## 2021-06-20 NOTE — Assessment & Plan Note (Addendum)
Will check her labs today  Refill her meds- biktarvy She asks about cabaneuva, will ask pharm to eval.  Offered/refused condoms.  Refer for pap Return to clinic 3 months.

## 2021-06-20 NOTE — Assessment & Plan Note (Signed)
Encouraged to keep feet up Encouraged to wear compression socks

## 2021-06-20 NOTE — Progress Notes (Signed)
   Subjective:    Patient ID: Beth George, female  DOB: 12-23-1995, 25 y.o.        MRN: [id number]   HPI 25 yo F HIV+ since 09-2008. She is on biktarvy, her only rx except for ISN-TRV when she was pregnant. ? If she has gestational Dm.  She delivered HIV- girl 05-2019. Has not had her anemia checked since she delivered.  No problems with her ART. Needs refill. Has had swelling in her feet, when she is on her feet all day at work.   Needs PAP Has not had COVID vax.    HIV 1 RNA Quant (copies/mL)  Date Value  03/29/2020 <20 NOT DETECTED     Health Maintenance  Topic Date Due  . COVID-19 Vaccine (1) Never done  . HPV VACCINES (1 - Risk 3-dose series) Never done  . INFLUENZA VACCINE  06/17/2021  . Pneumococcal Vaccine 68-64 Years old (3 - PPSV23 or PCV20) 11/26/2021  . PAP-Cervical Cytology Screening  12/30/2021  . PAP SMEAR-Modifier  12/30/2021  . TETANUS/TDAP  03/28/2029  . Hepatitis C Screening  Completed  . HIV Screening  Completed      Review of Systems  Constitutional:  Negative for chills, fever and weight loss.  Respiratory:  Negative for cough and shortness of breath.   Cardiovascular:  Positive for leg swelling.  Gastrointestinal:  Negative for constipation and diarrhea.  Genitourinary:  Negative for dysuria.   Please see HPI. All other systems reviewed and negative.     Objective:  Physical Exam Vitals reviewed.  Constitutional:      Appearance: She is obese.  HENT:     Mouth/Throat:     Mouth: Mucous membranes are moist.     Pharynx: No oropharyngeal exudate.  Eyes:     Extraocular Movements: Extraocular movements intact.     Pupils: Pupils are equal, round, and reactive to light.  Cardiovascular:     Rate and Rhythm: Normal rate and regular rhythm.  Pulmonary:     Effort: Pulmonary effort is normal.     Breath sounds: Normal breath sounds.  Abdominal:     General: Bowel sounds are normal. There is no distension.     Palpations: Abdomen is  soft.     Tenderness: There is no abdominal tenderness.  Musculoskeletal:        General: Normal range of motion.     Cervical back: Normal range of motion and neck supple.     Right lower leg: Edema present.     Left lower leg: Edema present.  Neurological:     General: No focal deficit present.     Mental Status: She is alert.          Assessment & Plan:

## 2021-06-21 LAB — T-HELPER CELL (CD4) - (RCID CLINIC ONLY)
CD4 % Helper T Cell: 42 % (ref 33–65)
CD4 T Cell Abs: 749 /uL (ref 400–1790)

## 2021-06-22 LAB — CBC
HCT: 33.8 % — ABNORMAL LOW (ref 35.0–45.0)
Hemoglobin: 10.6 g/dL — ABNORMAL LOW (ref 11.7–15.5)
MCH: 26.6 pg — ABNORMAL LOW (ref 27.0–33.0)
MCHC: 31.4 g/dL — ABNORMAL LOW (ref 32.0–36.0)
MCV: 84.7 fL (ref 80.0–100.0)
MPV: 10.9 fL (ref 7.5–12.5)
Platelets: 312 10*3/uL (ref 140–400)
RBC: 3.99 10*6/uL (ref 3.80–5.10)
RDW: 15.5 % — ABNORMAL HIGH (ref 11.0–15.0)
WBC: 5.9 10*3/uL (ref 3.8–10.8)

## 2021-06-22 LAB — HIV-1 RNA QUANT-NO REFLEX-BLD
HIV 1 RNA Quant: NOT DETECTED Copies/mL
HIV-1 RNA Quant, Log: NOT DETECTED Log cps/mL

## 2021-06-22 LAB — RPR: RPR Ser Ql: NONREACTIVE

## 2021-06-26 ENCOUNTER — Other Ambulatory Visit (HOSPITAL_COMMUNITY): Payer: Self-pay

## 2021-07-16 ENCOUNTER — Ambulatory Visit: Payer: Medicaid Other | Admitting: Infectious Diseases

## 2021-07-31 ENCOUNTER — Other Ambulatory Visit (HOSPITAL_COMMUNITY): Payer: Self-pay

## 2021-09-04 ENCOUNTER — Other Ambulatory Visit (HOSPITAL_COMMUNITY): Payer: Self-pay

## 2021-09-04 ENCOUNTER — Other Ambulatory Visit: Payer: Self-pay | Admitting: Pharmacist

## 2021-09-04 DIAGNOSIS — B20 Human immunodeficiency virus [HIV] disease: Secondary | ICD-10-CM

## 2021-09-04 MED ORDER — CABOTEGRAVIR & RILPIVIRINE ER 600 & 900 MG/3ML IM SUER
1.0000 | INTRAMUSCULAR | 5 refills | Status: DC
Start: 2021-09-04 — End: 2022-10-28
  Filled 2021-09-04: qty 6, 60d supply, fill #0
  Filled 2021-10-29: qty 6, 34d supply, fill #0
  Filled 2021-12-23 – 2021-12-25 (×3): qty 6, 34d supply, fill #1
  Filled 2021-12-30: qty 6, 34d supply, fill #0
  Filled 2022-02-18: qty 6, 34d supply, fill #1
  Filled 2022-04-21: qty 6, 34d supply, fill #2
  Filled 2022-06-23: qty 6, 34d supply, fill #3
  Filled 2022-08-19 (×2): qty 6, 34d supply, fill #4

## 2021-09-04 MED ORDER — CABOTEGRAVIR & RILPIVIRINE ER 600 & 900 MG/3ML IM SUER
1.0000 | INTRAMUSCULAR | 5 refills | Status: DC
  Filled 2021-09-04: qty 6, 60d supply, fill #0

## 2021-09-04 MED ORDER — CABOTEGRAVIR & RILPIVIRINE ER 600 & 900 MG/3ML IM SUER
1.0000 | INTRAMUSCULAR | 1 refills | Status: DC
Start: 2021-09-04 — End: 2023-07-09
  Filled 2021-09-04 (×2): qty 6, 30d supply, fill #0
  Filled 2021-09-24 – 2021-09-27 (×2): qty 6, 30d supply, fill #1

## 2021-09-04 MED ORDER — CABOTEGRAVIR & RILPIVIRINE ER 600 & 900 MG/3ML IM SUER
1.0000 | INTRAMUSCULAR | 1 refills | Status: DC
  Filled 2021-09-04: qty 6, 30d supply, fill #0

## 2021-09-06 ENCOUNTER — Telehealth: Payer: Self-pay | Admitting: Pharmacist

## 2021-09-06 ENCOUNTER — Encounter: Payer: Medicaid Other | Admitting: Pharmacist

## 2021-09-06 ENCOUNTER — Other Ambulatory Visit: Payer: Medicaid Other

## 2021-09-06 NOTE — Telephone Encounter (Signed)
Patient's medication was delivered today to clinic and will be administered at next clinic visit.  Margarite Gouge, PharmD, CPP Clinical Pharmacist Practitioner Infectious Diseases Clinical Pharmacist [facility] for Infectious Disease

## 2021-09-09 NOTE — Telephone Encounter (Signed)
Thanks Dorie!

## 2021-09-09 NOTE — Telephone Encounter (Signed)
Patient's mom called and requested that she be scheduled same day as her for injection; she's been rescheduled with [name] on 10/27 for injection instead of 11/4 with Dr. Ninetta Lights.    Roman Dubuc Loyola Mast, RN

## 2021-09-12 ENCOUNTER — Ambulatory Visit (INDEPENDENT_AMBULATORY_CARE_PROVIDER_SITE_OTHER): Payer: Medicaid Other | Admitting: Pharmacist

## 2021-09-12 ENCOUNTER — Other Ambulatory Visit: Payer: Self-pay

## 2021-09-12 DIAGNOSIS — B20 Human immunodeficiency virus [HIV] disease: Secondary | ICD-10-CM

## 2021-09-12 NOTE — Progress Notes (Signed)
Patient arrived today with her mother to receive her first Cabenuva injection. She wants to wait and start next month so she can line up her every 2 month shots with her mother's schedule. No injection given today.   Margarite Gouge, PharmD, CPP Clinical Pharmacist Practitioner Infectious Diseases Clinical Pharmacist [facility] for Infectious Disease

## 2021-09-20 ENCOUNTER — Encounter: Payer: Medicaid Other | Admitting: Infectious Diseases

## 2021-09-23 ENCOUNTER — Other Ambulatory Visit (HOSPITAL_COMMUNITY): Payer: Self-pay

## 2021-09-24 ENCOUNTER — Other Ambulatory Visit (HOSPITAL_COMMUNITY): Payer: Self-pay

## 2021-09-27 ENCOUNTER — Other Ambulatory Visit (HOSPITAL_COMMUNITY): Payer: Self-pay

## 2021-09-30 ENCOUNTER — Other Ambulatory Visit (HOSPITAL_COMMUNITY): Payer: Self-pay

## 2021-09-30 ENCOUNTER — Telehealth: Payer: Self-pay

## 2021-09-30 NOTE — Telephone Encounter (Signed)
[facility] Patient Advocate Encounter  Patient's medication (Cabenuva)have been couriered to [facility] from [facility] and will be administered on patient next office visit on 10/03/21.  [name] , CPhT Specialty Pharmacy Patient [facility] for Infectious Disease Phone: [phone] Fax:  [phone]

## 2021-10-03 ENCOUNTER — Other Ambulatory Visit (HOSPITAL_COMMUNITY)
Admission: RE | Admit: 2021-10-03 | Discharge: 2021-10-03 | Disposition: A | Discharge status: Home / Self Care | Payer: Medicaid Other | Source: Ambulatory Visit | Attending: Infectious Diseases | Admitting: Infectious Diseases

## 2021-10-03 ENCOUNTER — Other Ambulatory Visit: Payer: Self-pay

## 2021-10-03 ENCOUNTER — Ambulatory Visit (INDEPENDENT_AMBULATORY_CARE_PROVIDER_SITE_OTHER): Payer: Medicaid Other | Admitting: Pharmacist

## 2021-10-03 DIAGNOSIS — B20 Human immunodeficiency virus [HIV] disease: Secondary | ICD-10-CM | POA: Diagnosis not present

## 2021-10-03 MED ORDER — CABOTEGRAVIR & RILPIVIRINE ER 600 & 900 MG/3ML IM SUER
1.0000 | Freq: Once | INTRAMUSCULAR | Status: AC
  Administered 2021-10-03: 11:00:00 1 via INTRAMUSCULAR

## 2021-10-03 NOTE — Progress Notes (Signed)
HPI: Beth George is a 25 y.o. female who presents to the [place] clinic for [place] administration.  Patient Active Problem List   Diagnosis Date Noted   Leg edema 06/20/2021   Obesity (BMI 30-39.9) 06/20/2021   Trichomonal vaginitis in pregnancy 04/01/2019   Group B streptococcus urinary tract infection affecting pregnancy 01/05/2019   Supervision of high risk pregnancy, antepartum 12/30/2018   Anemia in pregnancy 05/19/2017   HIV disease ([place]) 03/05/2017    Patient's Medications  New Prescriptions   No medications on file  Previous Medications   BICTEGRAVIR-EMTRICITABINE-TENOFOVIR AF (BIKTARVY) 50-200-25 MG TABS TABLET    Take 1 tablet by mouth daily.   CABOTEGRAVIR & RILPIVIRINE ER (CABENUVA) 600 & 900 MG/3ML INJECTION    Inject 1 kit into the muscle every 2 (two) months.   CABOTEGRAVIR & RILPIVIRINE ER (CABENUVA) 600 & 900 MG/3ML INJECTION    Inject 1 kit into the muscle every 30 (thirty) days.  Modified Medications   No medications on file  Discontinued Medications   No medications on file    Allergies: No Known Allergies  Past Medical History: Past Medical History:  Diagnosis Date   HIV disease affecting pregnancy 03/05/2017   CD4 586 On medication per med list Followed at [facility] to [facility] ID.     Social History: Social History   Socioeconomic History   Marital status: Single    Spouse name: Not on file   Number of children: Not on file   Years of education: Not on file   Highest education level: Not on file  Occupational History   Not on file  Tobacco Use   Smoking status: Never   Smokeless tobacco: Never  Vaping Use   Vaping Use: Never used  Substance and Sexual Activity   Alcohol use: Yes    Comment: weekends   Drug use: No   Sexual activity: Yes    Birth control/protection: None, Condom    Comment: given condoms  Other Topics Concern   Not on file  Social History Narrative   Not on file   Social  Determinants of Health   Financial Resource Strain: Not on file  Food Insecurity: Not on file  Transportation Needs: Not on file  Physical Activity: Not on file  Stress: Not on file  Social Connections: Not on file    Labs: Lab Results  Component Value Date   HIV1RNAQUANT Not Detected 06/20/2021   HIV1RNAQUANT <20 NOT DETECTED 03/29/2020   CD4TABS 749 06/20/2021    RPR and STI Lab Results  Component Value Date   LABRPR NON-REACTIVE 06/20/2021   LABRPR NON-REACTIVE 03/29/2020   LABRPR Non Reactive 03/29/2019   LABRPR Non Reactive 12/30/2018   LABRPR Non Reactive 07/12/2017    STI Results GC CT  12/30/2018 Negative Negative  06/30/2017 Negative Negative  03/17/2017 Negative Negative    Hepatitis B Lab Results  Component Value Date   HEPBSAG Negative 12/30/2018   Hepatitis C No results found for: HEPCAB, HCVRNAPCRQN Hepatitis A No results found for: HAV Lipids: No results found for: CHOL, TRIG, HDL, CHOLHDL, VLDL, LDLCALC  Current HIV Regimen: Cabenuva  TARGET DATE: The 17th of the month  Assessment: Beth George presents today for their first initiation injection for Cabenuva. Counseled that [place] is two separate intramuscular injections in the gluteal muscle on each side for each visit. Explained that the second injection is 30 days after the initial injection then every 2 months thereafter. Discussed the need for viral load monitoring  every 2 months for the first 6 months and then periodically afterwards as their provider sees the need. Discussed the rare but significant chance of developing resistance despite compliance. Explained that showing up to injection appointments is very important and warned that if 2 appointments are missed, it will be reassessed by their provider whether they are a good candidate for injection therapy. Counseled on possible side effects associated with the injections such as injection site pain, which is usually mild to moderate in nature,  injection site nodules, and injection site reactions. Asked to call the clinic or send me a mychart message if they experience any issues, such as fatigue, nausea, headache, rash, or dizziness. Advised that they can take ibuprofen or tylenol for injection site pain if needed.   Administered cabotegravir 690m/3mL in left upper outer quadrant of the gluteal muscle. Administered rilpivirine 900 mg/338min the right upper outer quadrant of the gluteal muscle. Monitored patient for 10 minutes after injection. Injections were tolerated well without issue. Counseled to stop taking Biktarvy after today's dose and to call with any issues that may arise. Will make follow up appointments for second initiation injection in 30 days and then maintenance injections every 2 months thereafter for 6 months.   Plan: - Stop Biktarvy - First Cabenuva injections administered - Check HIV RNA and urine cytology today - Second initiation injection scheduled for 11/07/21 9:15 - Call with any issues or questions  AlLestine BoxPharmD PGY2 Infectious Diseases Pharmacy Resident

## 2021-10-04 LAB — URINE CYTOLOGY ANCILLARY ONLY
Chlamydia: NEGATIVE
Comment: NEGATIVE
Comment: NORMAL
Neisseria Gonorrhea: NEGATIVE

## 2021-10-07 LAB — HIV-1 RNA QUANT-NO REFLEX-BLD
HIV 1 RNA Quant: 29 Copies/mL — ABNORMAL HIGH
HIV-1 RNA Quant, Log: 1.46 Log cps/mL — ABNORMAL HIGH

## 2021-10-15 ENCOUNTER — Other Ambulatory Visit (HOSPITAL_COMMUNITY): Payer: Self-pay

## 2021-10-29 ENCOUNTER — Other Ambulatory Visit (HOSPITAL_COMMUNITY): Payer: Self-pay

## 2021-10-30 ENCOUNTER — Other Ambulatory Visit (HOSPITAL_COMMUNITY): Payer: Self-pay

## 2021-10-31 ENCOUNTER — Telehealth: Payer: Self-pay

## 2021-10-31 NOTE — Telephone Encounter (Signed)
[facility] Patient Advocate Encounter  Patient's medication Renaldo Harrison) have been couriered to [facility] from [employer] [employer] [facility] and will be administered on the patient next office visit on 11/07/21.  [name] , CPhT Specialty Pharmacy Patient [facility] for Infectious Disease Phone: [phone] Fax:  [phone]

## 2021-11-04 ENCOUNTER — Other Ambulatory Visit (HOSPITAL_COMMUNITY): Payer: Self-pay

## 2021-11-07 ENCOUNTER — Encounter: Payer: Medicaid Other | Admitting: Pharmacist

## 2021-11-08 ENCOUNTER — Ambulatory Visit (INDEPENDENT_AMBULATORY_CARE_PROVIDER_SITE_OTHER): Payer: Medicaid Other | Admitting: Pharmacist

## 2021-11-08 ENCOUNTER — Other Ambulatory Visit: Payer: Self-pay

## 2021-11-08 DIAGNOSIS — B20 Human immunodeficiency virus [HIV] disease: Secondary | ICD-10-CM | POA: Diagnosis not present

## 2021-11-08 MED ORDER — CABOTEGRAVIR & RILPIVIRINE ER 600 & 900 MG/3ML IM SUER
1.0000 | Freq: Once | INTRAMUSCULAR | Status: AC
  Administered 2021-11-08: 12:00:00 1 via INTRAMUSCULAR

## 2021-11-08 NOTE — Progress Notes (Addendum)
HPI: Beth George is a 25 y.o. female who presents to the [place] clinic for [place] administration.  Patient Active Problem List   Diagnosis Date Noted   Leg edema 06/20/2021   Obesity (BMI 30-39.9) 06/20/2021   Trichomonal vaginitis in pregnancy 04/01/2019   Group B streptococcus urinary tract infection affecting pregnancy 01/05/2019   Supervision of high risk pregnancy, antepartum 12/30/2018   Anemia in pregnancy 05/19/2017   HIV disease ([place]) 03/05/2017    Patient's Medications  New Prescriptions   No medications on file  Previous Medications   BICTEGRAVIR-EMTRICITABINE-TENOFOVIR AF (BIKTARVY) 50-200-25 MG TABS TABLET    Take 1 tablet by mouth daily.   CABOTEGRAVIR & RILPIVIRINE ER (CABENUVA) 600 & 900 MG/3ML INJECTION    Inject 1 kit into the muscle every 2 (two) months.   CABOTEGRAVIR & RILPIVIRINE ER (CABENUVA) 600 & 900 MG/3ML INJECTION    Inject 1 kit into the muscle every 30 (thirty) days.  Modified Medications   No medications on file  Discontinued Medications   No medications on file    Allergies: No Known Allergies  Past Medical History: Past Medical History:  Diagnosis Date   HIV disease affecting pregnancy 03/05/2017   CD4 586 On medication per med list Followed at [facility] to [facility] ID.     Social History: Social History   Socioeconomic History   Marital status: Single    Spouse name: Not on file   Number of children: Not on file   Years of education: Not on file   Highest education level: Not on file  Occupational History   Not on file  Tobacco Use   Smoking status: Never   Smokeless tobacco: Never  Vaping Use   Vaping Use: Never used  Substance and Sexual Activity   Alcohol use: Yes    Comment: weekends   Drug use: No   Sexual activity: Yes    Birth control/protection: None, Condom    Comment: given condoms  Other Topics Concern   Not on file  Social History Narrative   Not on file   Social  Determinants of Health   Financial Resource Strain: Not on file  Food Insecurity: Not on file  Transportation Needs: Not on file  Physical Activity: Not on file  Stress: Not on file  Social Connections: Not on file    Labs: Lab Results  Component Value Date   HIV1RNAQUANT 29 (H) 10/03/2021   HIV1RNAQUANT Not Detected 06/20/2021   HIV1RNAQUANT <20 NOT DETECTED 03/29/2020   CD4TABS 749 06/20/2021    RPR and STI Lab Results  Component Value Date   LABRPR NON-REACTIVE 06/20/2021   LABRPR NON-REACTIVE 03/29/2020   LABRPR Non Reactive 03/29/2019   LABRPR Non Reactive 12/30/2018   LABRPR Non Reactive 07/12/2017    STI Results GC CT  10/03/2021 Negative Negative  12/30/2018 Negative Negative  06/30/2017 Negative Negative  03/17/2017 Negative Negative    Hepatitis B Lab Results  Component Value Date   HEPBSAG Negative 12/30/2018   Hepatitis C No results found for: HEPCAB, HCVRNAPCRQN Hepatitis A No results found for: HAV Lipids: No results found for: CHOL, TRIG, HDL, CHOLHDL, VLDL, LDLCALC  TARGET DATE:  The 17th of the month  Current HIV Regimen: Cabenuva  Assessment: Beth George presents today for their maintenance Cabenuva injections with her mother Beth George. Initial/past injections were tolerated well without issues. No problems with systemic effects of injections. Patient did experience mild, non-painful knots on her right side.   Administered cabotegravir 680m/3mL  in left upper outer quadrant of the gluteal muscle. Administered rilpivirine 900 mg/70m in the right upper outer quadrant of the gluteal muscle. Monitored patient for 10 minutes after injection. Injections were tolerated well without issue. Patient will follow up in 2 months for next injection.  Plan: - Cabenuva injections administered - Next injections scheduled for 2/16 and 4/13 with me, and 6/16 with Dr. HJohnnye Sima- Call with any issues or questions  AAlfonse Spruce PharmD, CPP Clinical Pharmacist  Practitioner [facility]

## 2021-11-11 LAB — HIV-1 RNA QUANT-NO REFLEX-BLD
HIV 1 RNA Quant: <20 Copies/mL — ABNORMAL HIGH
HIV-1 RNA Quant, Log: <1.3 Log cps/mL — ABNORMAL HIGH

## 2021-11-22 ENCOUNTER — Other Ambulatory Visit (HOSPITAL_COMMUNITY): Payer: Self-pay

## 2021-12-23 ENCOUNTER — Other Ambulatory Visit (HOSPITAL_COMMUNITY): Payer: Self-pay

## 2021-12-24 ENCOUNTER — Other Ambulatory Visit (HOSPITAL_COMMUNITY): Payer: Self-pay

## 2021-12-25 ENCOUNTER — Other Ambulatory Visit (HOSPITAL_COMMUNITY): Payer: Self-pay

## 2021-12-27 ENCOUNTER — Other Ambulatory Visit (HOSPITAL_COMMUNITY): Payer: Self-pay

## 2021-12-30 ENCOUNTER — Other Ambulatory Visit (HOSPITAL_COMMUNITY): Payer: Self-pay

## 2021-12-30 ENCOUNTER — Other Ambulatory Visit: Payer: Self-pay

## 2021-12-31 ENCOUNTER — Telehealth: Payer: Self-pay

## 2021-12-31 NOTE — Telephone Encounter (Signed)
[facility] Patient Advocate Encounter  Patient's medication Renaldo Harrison) have been couriered to [facility] from [employer] and will be administered on patient next office visit on 01/02/22.  [name] , CPhT Specialty Pharmacy Patient [facility] for Infectious Disease Phone: [phone] Fax:  [phone]

## 2022-01-02 ENCOUNTER — Ambulatory Visit (INDEPENDENT_AMBULATORY_CARE_PROVIDER_SITE_OTHER): Payer: Medicaid Other | Admitting: Pharmacist

## 2022-01-02 ENCOUNTER — Other Ambulatory Visit: Payer: Self-pay

## 2022-01-02 DIAGNOSIS — B20 Human immunodeficiency virus [HIV] disease: Secondary | ICD-10-CM | POA: Diagnosis not present

## 2022-01-02 MED ORDER — CABOTEGRAVIR & RILPIVIRINE ER 600 & 900 MG/3ML IM SUER
1.0000 | Freq: Once | INTRAMUSCULAR | Status: AC
  Administered 2022-01-02: 1 via INTRAMUSCULAR

## 2022-01-02 NOTE — Progress Notes (Signed)
HPI: Beth George is a 26 y.o. female who presents to the [place] clinic for [place] administration.  Patient Active Problem List   Diagnosis Date Noted   Leg edema 06/20/2021   Obesity (BMI 30-39.9) 06/20/2021   Trichomonal vaginitis in pregnancy 04/01/2019   Group B streptococcus urinary tract infection affecting pregnancy 01/05/2019   Supervision of high risk pregnancy, antepartum 12/30/2018   Anemia in pregnancy 05/19/2017   HIV disease ([place]) 03/05/2017    Patient's Medications  New Prescriptions   No medications on file  Previous Medications   BICTEGRAVIR-EMTRICITABINE-TENOFOVIR AF (BIKTARVY) 50-200-25 MG TABS TABLET    Take 1 tablet by mouth daily.   CABOTEGRAVIR & RILPIVIRINE ER (CABENUVA) 600 & 900 MG/3ML INJECTION    Inject 1 kit into the muscle every 2 (two) months.   CABOTEGRAVIR & RILPIVIRINE ER (CABENUVA) 600 & 900 MG/3ML INJECTION    Inject 1 kit into the muscle every 30 (thirty) days.  Modified Medications   No medications on file  Discontinued Medications   No medications on file    Allergies: No Known Allergies  Past Medical History: Past Medical History:  Diagnosis Date   HIV disease affecting pregnancy 03/05/2017   CD4 586 On medication per med list Followed at [facility] to [facility] ID.     Social History: Social History   Socioeconomic History   Marital status: Single    Spouse name: Not on file   Number of children: Not on file   Years of education: Not on file   Highest education level: Not on file  Occupational History   Not on file  Tobacco Use   Smoking status: Never   Smokeless tobacco: Never  Vaping Use   Vaping Use: Never used  Substance and Sexual Activity   Alcohol use: Yes    Comment: weekends   Drug use: No   Sexual activity: Yes    Birth control/protection: None, Condom    Comment: given condoms  Other Topics Concern   Not on file  Social History Narrative   Not on file   Social  Determinants of Health   Financial Resource Strain: Not on file  Food Insecurity: Not on file  Transportation Needs: Not on file  Physical Activity: Not on file  Stress: Not on file  Social Connections: Not on file    Labs: Lab Results  Component Value Date   HIV1RNAQUANT <20 (H) 11/08/2021   HIV1RNAQUANT 29 (H) 10/03/2021   HIV1RNAQUANT Not Detected 06/20/2021   CD4TABS 749 06/20/2021    RPR and STI Lab Results  Component Value Date   LABRPR NON-REACTIVE 06/20/2021   LABRPR NON-REACTIVE 03/29/2020   LABRPR Non Reactive 03/29/2019   LABRPR Non Reactive 12/30/2018   LABRPR Non Reactive 07/12/2017    STI Results GC CT  10/03/2021 Negative Negative  12/30/2018 Negative Negative  06/30/2017 Negative Negative  03/17/2017 Negative Negative    Hepatitis B Lab Results  Component Value Date   HEPBSAG Negative 12/30/2018   Hepatitis C No results found for: HEPCAB, HCVRNAPCRQN Hepatitis A No results found for: HAV Lipids: No results found for: CHOL, TRIG, HDL, CHOLHDL, VLDL, LDLCALC  TARGET DATE:  The 17th of the month  Current HIV Regimen: Cabenuva  Assessment: Beth George presents today for their maintenance Cabenuva injections. Initial/past injections were tolerated well without issues. No problems with systemic effects of injections. Patient did experience mild knots that go away after a couple of weeks. Administered cabotegravir 615m/3mL in left upper  outer quadrant of the gluteal muscle. Administered rilpivirine 900 mg/1m in the right upper outer quadrant of the gluteal muscle. Monitored patient for 10 minutes after injection. Injections were tolerated well without issue. Patient will follow up in 2 months for next injection. Will check HIV RNA, CD4 count, urine cytology, RPR, and HBV sAb.  Beth George due for multiple vaccinations today and would like to defer at this time. She is eligible for Prevnar20 today to complete her pneumococcal vaccination series. Educated her on  qualifying for HPV vaccination and importance of preventing cervical cancer. She will research more about it and discuss at next visit. She is very hesitant with needles.  Of note, Beth George inquired about surrogacy today. Discussed that HIV+ patients can still serve as a surrogate as long as they remain undetectable. Reviewed that she would have to transition to an oral therapy during surrogacy due to lack of safety data for Cabenuva in pregnancy at this time.  Plan: - Cabenuva injections administered - Next injections scheduled for 4/13 with me and 6/16 with Dr. HJohnnye Sima- Check HIV RNA, CD4, urine cytology, RPR, and HBV sAb - Call with any issues or questions  AAlfonse Spruce PharmD, CPP Clinical Pharmacist Practitioner Infectious Diseases [facility]

## 2022-01-04 LAB — T-HELPER CELLS (CD4) COUNT (NOT AT ARMC)
Absolute CD4: 596 cells/uL (ref 490–1740)
CD4 T Helper %: 40 % (ref 30–61)
Total lymphocyte count: 1487 cells/uL (ref 850–3900)

## 2022-01-04 LAB — HIV-1 RNA QUANT-NO REFLEX-BLD
HIV 1 RNA Quant: NOT DETECTED Copies/mL
HIV-1 RNA Quant, Log: NOT DETECTED Log cps/mL

## 2022-01-04 LAB — HEPATITIS B SURFACE ANTIBODY,QUALITATIVE: Hep B S Ab: NONREACTIVE

## 2022-01-04 LAB — C. TRACHOMATIS/N. GONORRHOEAE RNA
C. trachomatis RNA, TMA: NOT DETECTED
N. gonorrhoeae RNA, TMA: NOT DETECTED

## 2022-01-04 LAB — RPR: RPR Ser Ql: NONREACTIVE

## 2022-01-09 ENCOUNTER — Other Ambulatory Visit: Payer: Self-pay

## 2022-01-20 ENCOUNTER — Other Ambulatory Visit (HOSPITAL_COMMUNITY): Payer: Self-pay

## 2022-01-22 ENCOUNTER — Other Ambulatory Visit: Payer: Self-pay

## 2022-02-18 ENCOUNTER — Other Ambulatory Visit (HOSPITAL_COMMUNITY): Payer: Self-pay

## 2022-02-19 ENCOUNTER — Telehealth: Payer: Self-pay

## 2022-02-19 NOTE — Telephone Encounter (Signed)
[facility] Patient Advocate Encounter ? ?Patient's medication Renaldo Harrison) have been couriered to [facility] from [employer] [facility] and will be administered on the patient next office visit on 02/27/22. ? ?[name] , CPhT ?Specialty Pharmacy Patient Advocate ?[facility] ?Phone: [phone] ?Fax:  [phone]  ?

## 2022-02-27 ENCOUNTER — Ambulatory Visit (INDEPENDENT_AMBULATORY_CARE_PROVIDER_SITE_OTHER): Payer: Medicaid Other | Admitting: Pharmacist

## 2022-02-27 ENCOUNTER — Other Ambulatory Visit: Payer: Self-pay

## 2022-02-27 DIAGNOSIS — B20 Human immunodeficiency virus [HIV] disease: Secondary | ICD-10-CM

## 2022-02-27 MED ORDER — CABOTEGRAVIR & RILPIVIRINE ER 600 & 900 MG/3ML IM SUER
1.0000 | Freq: Once | INTRAMUSCULAR | Status: AC
  Administered 2022-02-27: 1 via INTRAMUSCULAR

## 2022-02-27 NOTE — Progress Notes (Signed)
? ?HPI: Beth George is a 26 y.o. female who presents to the [place] clinic for [place] administration. ? ?Patient Active Problem List  ? Diagnosis Date Noted  ? Leg edema 06/20/2021  ? Obesity (BMI 30-39.9) 06/20/2021  ? Trichomonal vaginitis in pregnancy 04/01/2019  ? Group B streptococcus urinary tract infection affecting pregnancy 01/05/2019  ? Supervision of high risk pregnancy, antepartum 12/30/2018  ? Anemia in pregnancy 05/19/2017  ? HIV disease ([place]) 03/05/2017  ? ? ?Patient's Medications  ?New Prescriptions  ? No medications on file  ?Previous Medications  ? CABOTEGRAVIR & RILPIVIRINE ER (CABENUVA) 600 & 900 MG/3ML INJECTION    Inject 1 kit into the muscle every 2 (two) months.  ? CABOTEGRAVIR & RILPIVIRINE ER (CABENUVA) 600 & 900 MG/3ML INJECTION    Inject 1 kit into the muscle every 30 (thirty) days.  ?Modified Medications  ? No medications on file  ?Discontinued Medications  ? No medications on file  ? ? ?Allergies: ?No Known Allergies ? ?Past Medical History: ?Past Medical History:  ?Diagnosis Date  ? HIV disease affecting pregnancy 03/05/2017  ? CD4 586 On medication per med list Followed at [facility] to [facility] ID.   ? ? ?Social History: ?Social History  ? ?Socioeconomic History  ? Marital status: Single  ?  Spouse name: Not on file  ? Number of children: Not on file  ? Years of education: Not on file  ? Highest education level: Not on file  ?Occupational History  ? Not on file  ?Tobacco Use  ? Smoking status: Never  ? Smokeless tobacco: Never  ?Vaping Use  ? Vaping Use: Never used  ?Substance and Sexual Activity  ? Alcohol use: Yes  ?  Comment: weekends  ? Drug use: No  ? Sexual activity: Yes  ?  Birth control/protection: None, Condom  ?  Comment: given condoms  ?Other Topics Concern  ? Not on file  ?Social History Narrative  ? Not on file  ? ?Social Determinants of Health  ? ?Financial Resource Strain: Not on file  ?Food Insecurity: Not on file  ?Transportation Needs:  Not on file  ?Physical Activity: Not on file  ?Stress: Not on file  ?Social Connections: Not on file  ? ? ?Labs: ?Lab Results  ?Component Value Date  ? HIV1RNAQUANT Not Detected 01/02/2022  ? HIV1RNAQUANT <20 (H) 11/08/2021  ? HIV1RNAQUANT 29 (H) 10/03/2021  ? CD4TABS 749 06/20/2021  ? ? ?RPR and STI ?Lab Results  ?Component Value Date  ? LABRPR NON-REACTIVE 01/02/2022  ? LABRPR NON-REACTIVE 06/20/2021  ? LABRPR NON-REACTIVE 03/29/2020  ? LABRPR Non Reactive 03/29/2019  ? LABRPR Non Reactive 12/30/2018  ? ? ?STI Results GC CT  ?10/03/2021 ? 9:54 AM Negative   Negative    ?12/30/2018 ?12:00 AM Negative   Negative    ?06/30/2017 ?12:00 AM Negative   Negative    ?03/17/2017 ?12:00 AM Negative   Negative    ? ? ?Hepatitis B ?Lab Results  ?Component Value Date  ? HEPBSAB NON-REACTIVE 01/02/2022  ? HEPBSAG Negative 12/30/2018  ? ?Hepatitis C ?No results found for: [place], HCVRNAPCRQN ?Hepatitis A ?No results found for: HAV ?Lipids: ?No results found for: CHOL, TRIG, HDL, CHOLHDL, VLDL, LDLCALC ? ?TARGET DATE: ?The 17th of the month  ? ?Current HIV Regimen: ?Cabenuva ? ?Assessment: ?Beth George presents today for their maintenance Cabenuva injections. Past injections were tolerated well without issues. No problems with systemic side effects of injections. Beth George reports experiencing soreness for ~1 month following  her last injection. Reviewed that she can take ibuprofen or acetaminophen as needed, and use hot or cold compresses to help alleviate soreness. No new partners since last injection; she politely declined STI testing today.  ? ?Administered cabotegravir 675m/3mL in left upper outer quadrant of the gluteal muscle. Administered rilpivirine 900 mg/377min the right upper outer quadrant of the gluteal muscle. Injections were tolerated well. Patient will follow up in 2 months for next set of injections. Will check HIV RNA today.  ? ?Discussed eligibility for multiple vaccinations today, she would like to defer at this time and  discuss at next visit. She is very hesitant with needles.  ? ?Of note, also discussed future options for HIV care with Cabenuva injections and follow-up. Beth George expressed that with Dr. HaJohnnye Simaeaving our clinic, she would like to establish care with Dr. VaTommy Medal ? ?Plan: ?- Cabenuva injections administered ?- Check HIV RNA today ?- Next injections scheduled for 6/16 with Dr. HaJohnnye Simand 8/17 with Beth George?- Call with any issues or questions ? ? ?AmVance PeperPharmD ?PGY1 Pharmacy Resident ?02/27/2022 11:29 AM  ?

## 2022-03-01 LAB — HIV-1 RNA QUANT-NO REFLEX-BLD
HIV 1 RNA Quant: NOT DETECTED copies/mL
HIV-1 RNA Quant, Log: NOT DETECTED Log copies/mL

## 2022-03-13 ENCOUNTER — Other Ambulatory Visit (HOSPITAL_COMMUNITY): Payer: Self-pay

## 2022-03-27 ENCOUNTER — Other Ambulatory Visit (HOSPITAL_COMMUNITY): Payer: Self-pay

## 2022-04-09 ENCOUNTER — Encounter: Payer: Self-pay | Admitting: Infectious Diseases

## 2022-04-21 ENCOUNTER — Other Ambulatory Visit (HOSPITAL_COMMUNITY): Payer: Self-pay

## 2022-04-23 ENCOUNTER — Other Ambulatory Visit (HOSPITAL_COMMUNITY): Payer: Self-pay

## 2022-04-24 ENCOUNTER — Telehealth: Payer: Self-pay

## 2022-04-24 NOTE — Telephone Encounter (Signed)
[facility] Patient Advocate Encounter  Patient's medication Beth George) have been couriered to [facility] from [employer] [facility] and will be administered on the patient next office visit on 05/02/22.  [name] , CPhT Specialty Pharmacy Patient [facility] for Infectious Disease Phone: [phone] Fax:  [phone]

## 2022-04-25 DIAGNOSIS — A749 Chlamydial infection, unspecified: Secondary | ICD-10-CM | POA: Diagnosis not present

## 2022-04-25 DIAGNOSIS — Z7251 High risk heterosexual behavior: Secondary | ICD-10-CM | POA: Diagnosis not present

## 2022-05-02 ENCOUNTER — Encounter: Payer: Medicaid Other | Admitting: Infectious Diseases

## 2022-05-09 ENCOUNTER — Ambulatory Visit (INDEPENDENT_AMBULATORY_CARE_PROVIDER_SITE_OTHER): Payer: Medicaid Other | Admitting: Infectious Diseases

## 2022-05-09 ENCOUNTER — Encounter: Payer: Self-pay | Admitting: Infectious Diseases

## 2022-05-09 ENCOUNTER — Other Ambulatory Visit: Payer: Self-pay

## 2022-05-09 DIAGNOSIS — E669 Obesity, unspecified: Secondary | ICD-10-CM | POA: Diagnosis not present

## 2022-05-09 DIAGNOSIS — B20 Human immunodeficiency virus [HIV] disease: Secondary | ICD-10-CM

## 2022-05-09 MED ORDER — CABOTEGRAVIR & RILPIVIRINE ER 600 & 900 MG/3ML IM SUER
1.0000 | Freq: Once | INTRAMUSCULAR | Status: AC
  Administered 2022-05-09: 1 via INTRAMUSCULAR

## 2022-05-09 NOTE — Assessment & Plan Note (Addendum)
Doing well.  On cabaneuva managed by pharm Offered/refused condoms.  Offer COVID and flu in fall MD visit in 9 months.  cabaneuva scheduled by pharm Her labs are good.  She will check in desk on way out for a pap.

## 2022-06-12 ENCOUNTER — Other Ambulatory Visit (HOSPITAL_COMMUNITY): Payer: Self-pay

## 2022-06-17 ENCOUNTER — Other Ambulatory Visit (HOSPITAL_COMMUNITY): Payer: Self-pay

## 2022-06-23 ENCOUNTER — Other Ambulatory Visit (HOSPITAL_COMMUNITY): Payer: Self-pay

## 2022-06-24 ENCOUNTER — Other Ambulatory Visit (HOSPITAL_COMMUNITY): Payer: Self-pay

## 2022-06-25 ENCOUNTER — Telehealth: Payer: Self-pay | Admitting: Pharmacist

## 2022-06-25 NOTE — Telephone Encounter (Signed)
Patient's specialty medication (Cabenuva) was delivered from [facility] and will be administered at next office visit on 8/14.  Lisel Siegrist, PharmD, CPP, BCIDP Clinical Pharmacist Practitioner Infectious Diseases Clinical Pharmacist [facility]  

## 2022-07-03 ENCOUNTER — Encounter: Payer: Medicaid Other | Admitting: Pharmacist

## 2022-07-03 ENCOUNTER — Other Ambulatory Visit: Payer: Self-pay

## 2022-07-03 DIAGNOSIS — B20 Human immunodeficiency virus [HIV] disease: Secondary | ICD-10-CM

## 2022-07-03 DIAGNOSIS — Z59811 Housing instability, housed, with risk of homelessness: Secondary | ICD-10-CM

## 2022-07-03 NOTE — Progress Notes (Signed)
Patient would like assistance from [facility] connecting with a case worker in [employer] for housing resources. Patient expressed concerns of housing instability due to eviction notice. Referral for [facility] sent and Emma with [facility] will connect with patient today.  Valarie Cones

## 2022-07-04 ENCOUNTER — Other Ambulatory Visit (HOSPITAL_COMMUNITY)
Admission: RE | Admit: 2022-07-04 | Discharge: 2022-07-04 | Disposition: A | Discharge status: Home / Self Care | Payer: Medicaid Other | Source: Ambulatory Visit | Attending: Infectious Disease | Admitting: Infectious Disease

## 2022-07-04 ENCOUNTER — Other Ambulatory Visit: Payer: Self-pay

## 2022-07-04 ENCOUNTER — Ambulatory Visit (INDEPENDENT_AMBULATORY_CARE_PROVIDER_SITE_OTHER): Payer: Medicaid Other | Admitting: Pharmacist

## 2022-07-04 DIAGNOSIS — B20 Human immunodeficiency virus [HIV] disease: Secondary | ICD-10-CM | POA: Diagnosis not present

## 2022-07-04 DIAGNOSIS — Z23 Encounter for immunization: Secondary | ICD-10-CM

## 2022-07-04 MED ORDER — CABOTEGRAVIR & RILPIVIRINE ER 600 & 900 MG/3ML IM SUER
1.0000 | Freq: Once | INTRAMUSCULAR | Status: AC
  Administered 2022-07-04: 1 via INTRAMUSCULAR

## 2022-07-04 NOTE — Progress Notes (Signed)
HPI: Beth George is a 26 y.o. female who presents to the [facility] [facility] for [place] administration.  Patient Active Problem List   Diagnosis Date Noted   Leg edema 06/20/2021   Obesity (BMI 30-39.9) 06/20/2021   Trichomonal vaginitis in pregnancy 04/01/2019   Group B streptococcus urinary tract infection affecting pregnancy 01/05/2019   Supervision of high risk pregnancy, antepartum 12/30/2018   Anemia in pregnancy 05/19/2017   HIV disease ([place]) 03/05/2017    Patient's Medications  New Prescriptions   No medications on file  Previous Medications   CABOTEGRAVIR & RILPIVIRINE ER (CABENUVA) 600 & 900 MG/3ML INJECTION    Inject 1 kit into the muscle every 2 (two) months.   CABOTEGRAVIR & RILPIVIRINE ER (CABENUVA) 600 & 900 MG/3ML INJECTION    Inject 1 kit into the muscle every 30 (thirty) days.  Modified Medications   No medications on file  Discontinued Medications   No medications on file    Allergies: No Known Allergies  Past Medical History: Past Medical History:  Diagnosis Date   HIV disease affecting pregnancy 03/05/2017   CD4 586 On medication per med list [facility] to [facility] ID.     Social History: Social History   Socioeconomic History   Marital status: Single    Spouse name: Not on file   Number of children: Not on file   Years of education: Not on file   Highest education level: Not on file  Occupational History   Not on file  Tobacco Use   Smoking status: Never   Smokeless tobacco: Never  Vaping Use   Vaping Use: Never used  Substance and Sexual Activity   Alcohol use: Yes    Comment: weekends   Drug use: No   Sexual activity: Yes    Birth control/protection: None, Condom    Comment: declined condoms  Other Topics Concern   Not on file  Social History Narrative   Not on file   Social Determinants of Health   Financial Resource Strain: Not on file  Food Insecurity: Not on file  Transportation  Needs: Not on file  Physical Activity: Not on file  Stress: Not on file  Social Connections: Not on file    Labs: Lab Results  Component Value Date   HIV1RNAQUANT NOT DETECTED 02/27/2022   HIV1RNAQUANT Not Detected 01/02/2022   HIV1RNAQUANT <20 (H) 11/08/2021   CD4TABS 749 06/20/2021    RPR and STI Lab Results  Component Value Date   LABRPR NON-REACTIVE 01/02/2022   LABRPR NON-REACTIVE 06/20/2021   LABRPR NON-REACTIVE 03/29/2020   LABRPR Non Reactive 03/29/2019   LABRPR Non Reactive 12/30/2018    STI Results GC CT  10/03/2021  9:54 AM Negative  Negative   12/30/2018 12:00 AM Negative  Negative   06/30/2017 12:00 AM Negative  Negative   03/17/2017 12:00 AM Negative  Negative     Hepatitis B Lab Results  Component Value Date   HEPBSAB NON-REACTIVE 01/02/2022   HEPBSAG Negative 12/30/2018   Hepatitis C No results found for: "HEPCAB", "HCVRNAPCRQN" Hepatitis A No results found for: "HAV" Lipids: No results found for: "CHOL", "TRIG", "HDL", "CHOLHDL", "VLDL", "LDLCALC"  TARGET DATE:  The 17th of the month  Current HIV Regimen: Cabenuva  Assessment: Beth George presents today with her mother for their maintenance Cabenuva injections. Initial/past injections were tolerated well without issues. No problems with systemic effects of injections. Administered cabotegravir 621m/3mL in left upper outer quadrant of the gluteal muscle. Administered rilpivirine  900 mg/62m in the right upper outer quadrant of the gluteal muscle. Monitored patient for 10 minutes after injection. Injections were tolerated well without issue. Patient will follow up in 2 months for next injection. Will check HIV RNA today along with RPR and urine cytologies.  Patient did not demonstrate immunity to hepatitis B in February this year and has deferred vaccine until today. Administered first Heplisav today and will receive second at follow-up in 2 months with Dr. VTommy Medal She also requested to speak with [facility];  however, a case manager was not available today. Provided her with Emma's number. Will also see SColletta Marylandnext month for pap smear.   Plan: - Cabenuva injections administered - Check HIV RNA, RPR, and urine cytologies - Next injections scheduled for 10/10 with Dr. VTommy Medaland 12/12 with me  - Follow-up with SColletta Marylandon 9/21 for pap smear - Administer Heplisav 1/2 (second in October) - Call with any issues or questions  AAlfonse Spruce PharmD, CPP, BCanoocheeClinical Pharmacist Practitioner [place]for Infectious Disease

## 2022-07-07 LAB — RPR: RPR Ser Ql: NONREACTIVE

## 2022-07-07 LAB — URINE CYTOLOGY ANCILLARY ONLY
Chlamydia: NEGATIVE
Comment: NEGATIVE
Comment: NORMAL
Neisseria Gonorrhea: NEGATIVE

## 2022-07-07 LAB — HIV-1 RNA QUANT-NO REFLEX-BLD
HIV 1 RNA Quant: NOT DETECTED Copies/mL
HIV-1 RNA Quant, Log: NOT DETECTED Log cps/mL

## 2022-08-07 ENCOUNTER — Ambulatory Visit: Payer: Medicaid Other | Admitting: Infectious Diseases

## 2022-08-19 ENCOUNTER — Other Ambulatory Visit (HOSPITAL_COMMUNITY): Payer: Self-pay

## 2022-08-20 ENCOUNTER — Other Ambulatory Visit (HOSPITAL_COMMUNITY): Payer: Self-pay

## 2022-08-21 ENCOUNTER — Telehealth: Payer: Self-pay

## 2022-08-21 NOTE — Telephone Encounter (Signed)
[facility] Patient Advocate Encounter  Patient's medication Kern Reap) have been couriered to [facility] from [employer] and will be administered on the patient next office visit on 08/26/22.  Ileene Patrick , [facility] Patient [facility] for Infectious Disease Phone: [phone] Fax:  [phone]

## 2022-08-26 ENCOUNTER — Encounter: Payer: Medicaid Other | Admitting: Infectious Disease

## 2022-09-08 ENCOUNTER — Encounter: Payer: Medicaid Other | Admitting: Infectious Disease

## 2022-09-09 ENCOUNTER — Ambulatory Visit (INDEPENDENT_AMBULATORY_CARE_PROVIDER_SITE_OTHER): Payer: Medicaid Other | Admitting: Pharmacist

## 2022-09-09 ENCOUNTER — Other Ambulatory Visit: Payer: Self-pay

## 2022-09-09 DIAGNOSIS — B20 Human immunodeficiency virus [HIV] disease: Secondary | ICD-10-CM

## 2022-09-09 DIAGNOSIS — Z23 Encounter for immunization: Secondary | ICD-10-CM | POA: Diagnosis not present

## 2022-09-09 MED ORDER — CABOTEGRAVIR & RILPIVIRINE ER 600 & 900 MG/3ML IM SUER
1.0000 | Freq: Once | INTRAMUSCULAR | Status: AC
  Administered 2022-09-09: 1 via INTRAMUSCULAR

## 2022-09-09 NOTE — Progress Notes (Signed)
HPI: Beth George is a 26 y.o. female who presents to the [place] clinic for [place] administration.  Patient Active Problem List   Diagnosis Date Noted   Leg edema 06/20/2021   Obesity (BMI 30-39.9) 06/20/2021   Trichomonal vaginitis in pregnancy 04/01/2019   Group B streptococcus urinary tract infection affecting pregnancy 01/05/2019   Supervision of high risk pregnancy, antepartum 12/30/2018   Anemia in pregnancy 05/19/2017   HIV disease ([place]) 03/05/2017    Patient's Medications  New Prescriptions   No medications on file  Previous Medications   CABOTEGRAVIR & RILPIVIRINE ER (CABENUVA) 600 & 900 MG/3ML INJECTION    Inject 1 kit into the muscle every 2 (two) months.   CABOTEGRAVIR & RILPIVIRINE ER (CABENUVA) 600 & 900 MG/3ML INJECTION    Inject 1 kit into the muscle every 30 (thirty) days.  Modified Medications   No medications on file  Discontinued Medications   No medications on file    Allergies: No Known Allergies  Past Medical History: Past Medical History:  Diagnosis Date   HIV disease affecting pregnancy 03/05/2017   CD4 586 On medication per med list Followed at [facility] to [facility] ID.     Social History: Social History   Socioeconomic History   Marital status: Single    Spouse name: Not on file   Number of children: Not on file   Years of education: Not on file   Highest education level: Not on file  Occupational History   Not on file  Tobacco Use   Smoking status: Never   Smokeless tobacco: Never  Vaping Use   Vaping Use: Never used  Substance and Sexual Activity   Alcohol use: Yes    Comment: weekends   Drug use: No   Sexual activity: Yes    Birth control/protection: None, Condom    Comment: declined condoms  Other Topics Concern   Not on file  Social History Narrative   Not on file   Social Determinants of Health   Financial Resource Strain: Not on file  Food Insecurity: Not on file  Transportation  Needs: Not on file  Physical Activity: Not on file  Stress: Not on file  Social Connections: Not on file    Labs: Lab Results  Component Value Date   HIV1RNAQUANT Not Detected 07/04/2022   HIV1RNAQUANT NOT DETECTED 02/27/2022   HIV1RNAQUANT Not Detected 01/02/2022   CD4TABS 749 06/20/2021    RPR and STI Lab Results  Component Value Date   LABRPR NON-REACTIVE 07/04/2022   LABRPR NON-REACTIVE 01/02/2022   LABRPR NON-REACTIVE 06/20/2021   LABRPR NON-REACTIVE 03/29/2020   LABRPR Non Reactive 03/29/2019    STI Results GC CT  07/04/2022 11:34 AM Negative  Negative   10/03/2021  9:54 AM Negative  Negative   12/30/2018 12:00 AM Negative  Negative   06/30/2017 12:00 AM Negative  Negative   03/17/2017 12:00 AM Negative  Negative     Hepatitis B Lab Results  Component Value Date   HEPBSAB NON-REACTIVE 01/02/2022   HEPBSAG Negative 12/30/2018   Hepatitis C No results found for: "HEPCAB", "HCVRNAPCRQN" Hepatitis A No results found for: "HAV" Lipids: No results found for: "CHOL", "TRIG", "HDL", "CHOLHDL", "VLDL", "LDLCALC"  TARGET DATE: 17th  Assessment: Lejla presents today for her maintenance Cabenuva injections. Past injections were tolerated well without issues. She remains undetectable, last viral load checked 8/18. STI screening at this visit was negative. She shares that she missed her appointment with Dr. Tommy Medal due  to her transmission going out in her car. 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 She has new concerns for STIs and no new partners today and politely declines STI screening today. Discussed that she is due for second hepatitis B vaccine today which she agrees to receiving. However, she politely declines annual flu vaccine and updated COVID-19 vaccines.   Administered cabotegravir 641m/3mL in left upper outer quadrant of the  gluteal muscle. Administered rilpivirine 900 mg/368min the right upper outer quadrant of the gluteal muscle. No issues with injections. She will follow up in 2 months for next set of injections.  Plan: - Cabenuva injections administered - Administered second Heplisav-B vaccine - Scheduled PAP clinic appointment with StColletta Marylandn 10/02/22 - Next injections scheduled for 11/04/22 with Dr. VaTommy Medal02/15/2024 with AmEstill Bamberg Call with any issues or questions  CoEliseo GumPharmD PGY1 Pharmacy Resident   09/09/2022  12:06 PM

## 2022-10-02 ENCOUNTER — Ambulatory Visit: Payer: Medicaid Other | Admitting: Infectious Diseases

## 2022-10-28 ENCOUNTER — Encounter: Payer: Medicaid Other | Admitting: Pharmacist

## 2022-10-28 ENCOUNTER — Other Ambulatory Visit (HOSPITAL_COMMUNITY): Payer: Self-pay

## 2022-10-28 ENCOUNTER — Other Ambulatory Visit: Payer: Self-pay

## 2022-10-28 ENCOUNTER — Other Ambulatory Visit: Payer: Self-pay | Admitting: Pharmacist

## 2022-10-28 DIAGNOSIS — B20 Human immunodeficiency virus [HIV] disease: Secondary | ICD-10-CM

## 2022-10-28 MED ORDER — CABOTEGRAVIR & RILPIVIRINE ER 600 & 900 MG/3ML IM SUER
1.0000 | INTRAMUSCULAR | 5 refills | Status: DC
  Filled 2022-10-28 (×2): qty 6, 34d supply, fill #0

## 2022-10-29 ENCOUNTER — Telehealth: Payer: Self-pay

## 2022-10-29 NOTE — Telephone Encounter (Signed)
[facility] Patient Advocate Encounter  Patient's medications have been couriered to [facility] from  [facility] , and will be administered on  11/04/2022.

## 2022-11-04 ENCOUNTER — Ambulatory Visit (INDEPENDENT_AMBULATORY_CARE_PROVIDER_SITE_OTHER): Payer: Medicaid Other | Admitting: Infectious Disease

## 2022-11-04 ENCOUNTER — Encounter: Payer: Self-pay | Admitting: Infectious Disease

## 2022-11-04 ENCOUNTER — Other Ambulatory Visit (HOSPITAL_COMMUNITY)
Admission: RE | Admit: 2022-11-04 | Discharge: 2022-11-04 | Disposition: A | Discharge status: Home / Self Care | Payer: Medicaid Other | Source: Ambulatory Visit | Attending: Infectious Disease | Admitting: Infectious Disease

## 2022-11-04 ENCOUNTER — Other Ambulatory Visit (HOSPITAL_COMMUNITY): Payer: Self-pay

## 2022-11-04 ENCOUNTER — Other Ambulatory Visit: Payer: Self-pay

## 2022-11-04 VITALS — BP 147/98 | HR 77 | Temp 97.3°F | Ht 62.0 in | Wt 189.0 lb

## 2022-11-04 DIAGNOSIS — Z79899 Other long term (current) drug therapy: Secondary | ICD-10-CM | POA: Diagnosis not present

## 2022-11-04 DIAGNOSIS — R87619 Unspecified abnormal cytological findings in specimens from cervix uteri: Secondary | ICD-10-CM

## 2022-11-04 DIAGNOSIS — Z113 Encounter for screening for infections with a predominantly sexual mode of transmission: Secondary | ICD-10-CM | POA: Diagnosis not present

## 2022-11-04 DIAGNOSIS — Z7185 Encounter for immunization safety counseling: Secondary | ICD-10-CM

## 2022-11-04 DIAGNOSIS — B20 Human immunodeficiency virus [HIV] disease: Secondary | ICD-10-CM

## 2022-11-04 HISTORY — DX: Encounter for screening for infections with a predominantly sexual mode of transmission: Z11.3

## 2022-11-04 HISTORY — DX: Encounter for immunization safety counseling: Z71.85

## 2022-11-04 MED ORDER — CABOTEGRAVIR & RILPIVIRINE ER 600 & 900 MG/3ML IM SUER
1.0000 | INTRAMUSCULAR | 5 refills | Status: DC
  Filled 2022-11-04: qty 6, 60d supply, fill #0
  Filled 2022-12-22: qty 6, 34d supply, fill #0
  Filled 2023-02-19: qty 6, 34d supply, fill #1
  Filled 2023-04-24: qty 6, 34d supply, fill #2
  Filled 2023-06-23: qty 6, 34d supply, fill #3
  Filled 2023-08-20: qty 6, 34d supply, fill #4
  Filled 2023-10-08: qty 6, 34d supply, fill #5

## 2022-11-04 MED ORDER — CABOTEGRAVIR & RILPIVIRINE ER 600 & 900 MG/3ML IM SUER
1.0000 | Freq: Once | INTRAMUSCULAR | Status: AC
  Administered 2022-11-04: 1 via INTRAMUSCULAR

## 2022-11-04 NOTE — Progress Notes (Signed)
 Subjective:  Chief complaint: For HIV disease on Cabenuva injections  Patient ID: Beth George, female    DOB: 02/24/1996, 26 y.o.   MRN: [id number]  HPI  Beth George is a 26 year old Black woman with HIV whom we used to see in [place] clinic before it was close down now transferred care over to our [facility] who has been on Cabenuva injections with nice virological suppression.  She is in need of a Pap smear of asked her to make an appointment with Stephanie for this.    Past Medical History:  Diagnosis Date   HIV disease affecting pregnancy 03/05/2017   CD4 586 On medication per med list Followed at [facility].Transferring to [facility] ID.    Routine screening for STI (sexually transmitted infection) 11/04/2022   Vaccine counseling 11/04/2022    No past surgical history on file.  Family History  Problem Relation Age of Onset   Cancer Father    ADD / ADHD Mother    Asthma Mother    HIV Mother    Anxiety disorder Mother       Social History   Socioeconomic History   Marital status: Single    Spouse name: Not on file   Number of children: Not on file   Years of education: Not on file   Highest education level: Not on file  Occupational History   Not on file  Tobacco Use   Smoking status: Never   Smokeless tobacco: Never  Vaping Use   Vaping Use: Never used  Substance and Sexual Activity   Alcohol use: Yes    Comment: weekends   Drug use: No   Sexual activity: Yes    Birth control/protection: None, Condom    Comment: declined condoms  Other Topics Concern   Not on file  Social History Narrative   Not on file   Social Determinants of Health   Financial Resource Strain: Not on file  Food Insecurity: Not on file  Transportation Needs: Not on file  Physical Activity: Not on file  Stress: Not on file  Social Connections: Not on file    No Known Allergies   Current Outpatient Medications:    cabotegravir & rilpivirine ER (CABENUVA) 600 & 900 MG/3ML  injection, Inject 1 kit into the muscle every 30 (thirty) days., Disp: 6 mL, Rfl: 1   cabotegravir & rilpivirine ER (CABENUVA) 600 & 900 MG/3ML injection, Inject 1 kit into the muscle every 2 (two) months., Disp: 6 mL, Rfl: 5    Review of Systems  Constitutional:  Negative for activity change, appetite change, chills, diaphoresis, fatigue, fever and unexpected weight change.  HENT:  Negative for congestion, rhinorrhea, sinus pressure, sneezing, sore throat and trouble swallowing.   Eyes:  Negative for photophobia and visual disturbance.  Respiratory:  Negative for cough, chest tightness, shortness of breath, wheezing and stridor.   Cardiovascular:  Negative for chest pain, palpitations and leg swelling.  Gastrointestinal:  Negative for abdominal distention, abdominal pain, anal bleeding, blood in stool, constipation, diarrhea, nausea and vomiting.  Genitourinary:  Negative for difficulty urinating, dysuria, flank pain and hematuria.  Musculoskeletal:  Negative for arthralgias, back pain, gait problem, joint swelling and myalgias.  Skin:  Negative for color change, pallor, rash and wound.  Neurological:  Negative for dizziness, tremors, weakness and light-headedness.  Hematological:  Negative for adenopathy. Does not bruise/bleed easily.  Psychiatric/Behavioral:  Negative for agitation, behavioral problems, confusion, decreased concentration, dysphoric mood and sleep disturbance.          Objective:   Physical Exam Constitutional:      General: She is not in acute distress.    Appearance: Normal appearance. She is well-developed. She is not ill-appearing or diaphoretic.  HENT:     Head: Normocephalic and atraumatic.     Right Ear: Hearing and external ear normal.     Left Ear: Hearing and external ear normal.     Nose: No nasal deformity or rhinorrhea.  Eyes:     General: No scleral icterus.    Conjunctiva/sclera: Conjunctivae normal.     Right eye: Right conjunctiva is not injected.      Left eye: Left conjunctiva is not injected.     Pupils: Pupils are equal, round, and reactive to light.  Neck:     Vascular: No JVD.  Cardiovascular:     Rate and Rhythm: Normal rate and regular rhythm.     Heart sounds: Normal heart sounds, S1 normal and S2 normal. No murmur heard.    No friction rub.  Abdominal:     General: Bowel sounds are normal. There is no distension.     Palpations: Abdomen is soft.     Tenderness: There is no abdominal tenderness.  Musculoskeletal:        General: Normal range of motion.     Right shoulder: Normal.     Left shoulder: Normal.     Cervical back: Normal range of motion and neck supple.     Right hip: Normal.     Left hip: Normal.     Right knee: Normal.     Left knee: Normal.  Lymphadenopathy:     Head:     Right side of head: No submandibular, preauricular or posterior auricular adenopathy.     Left side of head: No submandibular, preauricular or posterior auricular adenopathy.     Cervical: No cervical adenopathy.     Right cervical: No superficial or deep cervical adenopathy.    Left cervical: No superficial or deep cervical adenopathy.  Skin:    General: Skin is warm and dry.     Coloration: Skin is not pale.     Findings: No abrasion, bruising, ecchymosis, erythema, lesion or rash.     Nails: There is no clubbing.  Neurological:     Mental Status: She is alert and oriented to person, place, and time.     Sensory: No sensory deficit.     Coordination: Coordination normal.     Gait: Gait normal.  Psychiatric:        Attention and Perception: She is attentive.        Mood and Affect: Mood normal.        Speech: Speech normal.        Behavior: Behavior normal. Behavior is cooperative.        Thought Content: Thought content normal.        Judgment: Judgment normal.           Assessment & Plan:   HIV disease:  I will add order HIV viral load CD4 count CBC with differential CMP, RPR GC and chlamydia and I will  continue  [place] prescription which she has 2 injections today.  STI screen we will screen for gonorrhea and chlamydia.  History of abnormal Pap smear of asked her to see Colletta Maryland for this.  Vaccine counseling recommended flu and COVID vaccines which she refused

## 2022-11-05 LAB — URINE CYTOLOGY ANCILLARY ONLY
Chlamydia: NEGATIVE
Comment: NEGATIVE
Comment: NORMAL
Neisseria Gonorrhea: NEGATIVE

## 2022-11-05 LAB — T-HELPER CELLS (CD4) COUNT (NOT AT ARMC)
CD4 % Helper T Cell: 47 % (ref 33–65)
CD4 T Cell Abs: 844 /uL (ref 400–1790)

## 2022-11-06 LAB — LIPID PANEL
Cholesterol: 162 mg/dL (ref <200)
HDL: 53 mg/dL (ref >=50)
LDL Cholesterol (Calc): 96 mg/dL (calc)
Non-HDL Cholesterol (Calc): 109 mg/dL (calc) (ref <130)
Total CHOL/HDL Ratio: 3.1 (calc) (ref <5.0)
Triglycerides: 44 mg/dL (ref <150)

## 2022-11-06 LAB — CBC WITH DIFFERENTIAL/PLATELET
Absolute Monocytes: 366 cells/uL (ref 200–950)
Basophils Absolute: 41 cells/uL (ref 0–200)
Basophils Relative: 1.1 %
Eosinophils Absolute: 70 cells/uL (ref 15–500)
Eosinophils Relative: 1.9 %
HCT: 34.1 % — ABNORMAL LOW (ref 35.0–45.0)
Hemoglobin: 11 g/dL — ABNORMAL LOW (ref 11.7–15.5)
Lymphs Abs: 1761 cells/uL (ref 850–3900)
MCH: 26.8 pg — ABNORMAL LOW (ref 27.0–33.0)
MCHC: 32.3 g/dL (ref 32.0–36.0)
MCV: 83.2 fL (ref 80.0–100.0)
MPV: 10.7 fL (ref 7.5–12.5)
Monocytes Relative: 9.9 %
Neutro Abs: 1462 cells/uL — ABNORMAL LOW (ref 1500–7800)
Neutrophils Relative %: 39.5 %
Platelets: 285 10*3/uL (ref 140–400)
RBC: 4.1 10*6/uL (ref 3.80–5.10)
RDW: 14.8 % (ref 11.0–15.0)
Total Lymphocyte: 47.6 %
WBC: 3.7 10*3/uL — ABNORMAL LOW (ref 3.8–10.8)

## 2022-11-06 LAB — COMPLETE METABOLIC PANEL WITH GFR
AG Ratio: 1.2 (calc) (ref 1.0–2.5)
ALT: 10 U/L (ref 6–29)
AST: 13 U/L (ref 10–30)
Albumin: 4.2 g/dL (ref 3.6–5.1)
Alkaline phosphatase (APISO): 51 U/L (ref 31–125)
BUN: 9 mg/dL (ref 7–25)
CO2: 26 mmol/L (ref 20–32)
Calcium: 9.4 mg/dL (ref 8.6–10.2)
Chloride: 105 mmol/L (ref 98–110)
Creat: 0.76 mg/dL (ref 0.50–0.96)
Globulin: 3.6 g/dL (calc) (ref 1.9–3.7)
Glucose, Bld: 87 mg/dL (ref 65–99)
Potassium: 3.8 mmol/L (ref 3.5–5.3)
Sodium: 139 mmol/L (ref 135–146)
Total Bilirubin: 0.4 mg/dL (ref 0.2–1.2)
Total Protein: 7.8 g/dL (ref 6.1–8.1)
eGFR: 110 mL/min/{1.73_m2} (ref >=60)

## 2022-11-06 LAB — HIV-1 RNA QUANT-NO REFLEX-BLD
HIV 1 RNA Quant: NOT DETECTED Copies/mL
HIV-1 RNA Quant, Log: NOT DETECTED Log cps/mL

## 2022-11-06 LAB — RPR: RPR Ser Ql: NONREACTIVE

## 2022-11-25 ENCOUNTER — Other Ambulatory Visit (HOSPITAL_COMMUNITY): Payer: Self-pay

## 2022-12-18 ENCOUNTER — Other Ambulatory Visit (HOSPITAL_COMMUNITY): Payer: Self-pay

## 2022-12-22 ENCOUNTER — Other Ambulatory Visit (HOSPITAL_COMMUNITY): Payer: Self-pay

## 2022-12-22 ENCOUNTER — Other Ambulatory Visit: Payer: Self-pay

## 2022-12-23 ENCOUNTER — Other Ambulatory Visit (HOSPITAL_COMMUNITY): Payer: Self-pay

## 2022-12-25 ENCOUNTER — Telehealth: Payer: Self-pay

## 2022-12-25 NOTE — Telephone Encounter (Signed)
[facility] Patient Advocate Encounter  Patient's medication Kern Reap) have been couriered to [facility] from [employer] and will be administered on the patient next office visit on 01/01/23.  Ileene Patrick , [facility] Patient [facility] for Infectious Disease Phone: [phone] Fax:  [phone]

## 2022-12-29 ENCOUNTER — Other Ambulatory Visit (HOSPITAL_COMMUNITY): Payer: Self-pay

## 2023-01-01 ENCOUNTER — Other Ambulatory Visit: Payer: Self-pay

## 2023-01-01 ENCOUNTER — Other Ambulatory Visit (HOSPITAL_COMMUNITY)
Admission: RE | Admit: 2023-01-01 | Discharge: 2023-01-01 | Disposition: A | Discharge status: Home / Self Care | Payer: Medicaid Other | Source: Ambulatory Visit | Attending: Infectious Disease | Admitting: Infectious Disease

## 2023-01-01 ENCOUNTER — Ambulatory Visit (INDEPENDENT_AMBULATORY_CARE_PROVIDER_SITE_OTHER): Payer: Medicaid Other | Admitting: Pharmacist

## 2023-01-01 DIAGNOSIS — B20 Human immunodeficiency virus [HIV] disease: Secondary | ICD-10-CM | POA: Insufficient documentation

## 2023-01-01 MED ORDER — CABOTEGRAVIR & RILPIVIRINE ER 600 & 900 MG/3ML IM SUER
1.0000 | Freq: Once | INTRAMUSCULAR | Status: AC
  Administered 2023-01-01: 1 via INTRAMUSCULAR

## 2023-01-01 NOTE — Progress Notes (Addendum)
HPI: Beth George is a 27 y.o. female who presents to the [place] clinic for [place] administration.  Patient Active Problem List   Diagnosis Date Noted   Routine screening for STI (sexually transmitted infection) 11/04/2022   Vaccine counseling 11/04/2022   Leg edema 06/20/2021   Obesity (BMI 30-39.9) 06/20/2021   Trichomonal vaginitis in pregnancy 04/01/2019   Group B streptococcus urinary tract infection affecting pregnancy 01/05/2019   Supervision of high risk pregnancy, antepartum 12/30/2018   Anemia in pregnancy 05/19/2017   HIV disease ([place]) 03/05/2017    Patient's Medications  New Prescriptions   No medications on file  Previous Medications   CABOTEGRAVIR & RILPIVIRINE ER (CABENUVA) 600 & 900 MG/3ML INJECTION    Inject 1 kit into the muscle every 30 (thirty) days.   CABOTEGRAVIR & RILPIVIRINE ER (CABENUVA) 600 & 900 MG/3ML INJECTION    Inject 1 kit into the muscle every 2 (two) months.  Modified Medications   No medications on file  Discontinued Medications   No medications on file    Allergies: No Known Allergies  Past Medical History: Past Medical History:  Diagnosis Date   HIV disease affecting pregnancy 03/05/2017   CD4 586 On medication per med list [facility] to [facility] ID.    Routine screening for STI (sexually transmitted infection) 11/04/2022   Vaccine counseling 11/04/2022    Social History: Social History   Socioeconomic History   Marital status: Single    Spouse name: Not on file   Number of children: Not on file   Years of education: Not on file   Highest education level: Not on file  Occupational History   Not on file  Tobacco Use   Smoking status: Never   Smokeless tobacco: Never  Vaping Use   Vaping Use: Never used  Substance and Sexual Activity   Alcohol use: Yes    Comment: weekends   Drug use: No   Sexual activity: Yes    Birth control/protection: None, Condom    Comment: declined  condoms  Other Topics Concern   Not on file  Social History Narrative   Not on file   Social Determinants of Health   Financial Resource Strain: Not on file  Food Insecurity: Not on file  Transportation Needs: Not on file  Physical Activity: Not on file  Stress: Not on file  Social Connections: Not on file    Labs: Lab Results  Component Value Date   HIV1RNAQUANT Not Detected 11/04/2022   HIV1RNAQUANT Not Detected 07/04/2022   HIV1RNAQUANT NOT DETECTED 02/27/2022   CD4TABS 844 11/04/2022   CD4TABS 749 06/20/2021    RPR and STI Lab Results  Component Value Date   LABRPR NON-REACTIVE 11/04/2022   LABRPR NON-REACTIVE 07/04/2022   LABRPR NON-REACTIVE 01/02/2022   LABRPR NON-REACTIVE 06/20/2021   LABRPR NON-REACTIVE 03/29/2020    STI Results GC CT  11/04/2022 10:39 AM Negative  Negative   07/04/2022 11:34 AM Negative  Negative   10/03/2021  9:54 AM Negative  Negative   12/30/2018 12:00 AM Negative  Negative   06/30/2017 12:00 AM Negative  Negative   03/17/2017 12:00 AM Negative  Negative     Hepatitis B Lab Results  Component Value Date   HEPBSAB NON-REACTIVE 01/02/2022   HEPBSAG Negative 12/30/2018   Hepatitis C No results found for: "HEPCAB", "HCVRNAPCRQN" Hepatitis A No results found for: "HAV" Lipids: Lab Results  Component Value Date   CHOL 162 11/04/2022   TRIG 44 11/04/2022  HDL 53 11/04/2022   CHOLHDL 3.1 11/04/2022   LDLCALC 96 11/04/2022    TARGET DATE: 17th  Assessment: Beth George presents today for maintenance Cabenuva injections. Past injections were tolerated well without issues. She reports no symptoms of STIs or known exposure to STIs. Today she agrees to oral and urine STI testing only. She is also due for her PAP but has had difficulty making it to appointments between Cabenuva injections.   She recently completed the HBV vaccination series in October 2023. Plan to collect confirmatory HBV antibody the next time she gets blood drawn.  Today she is eligible for COVID and flu vaccination. She chose to defer these vaccinations for a later date.   Administered cabotegravir 663m/3mL in left upper outer quadrant of the gluteal muscle. Administered rilpivirine 900 mg/332min the right upper outer quadrant of the gluteal muscle. No issues with injections. She will follow up in 2 months for next set of injections.  Plan: - Cabenuva injections administered - No HIV RNA today as she has been controlled on [place] Next injection and PAP scheduled for 4/11 with stephanie - Next injections scheduled for 05/07/23 with Dr. VaLucianne Leiam and  07/03/23 with AmEstill Bamberg Call with any issues or questions  KeTitus DubinPharmD PGY1 Pharmacy Resident 01/01/2023 10:58 AM

## 2023-01-02 ENCOUNTER — Encounter: Payer: Self-pay | Admitting: Pharmacist

## 2023-01-02 LAB — URINE CYTOLOGY ANCILLARY ONLY
Chlamydia: NEGATIVE
Comment: NEGATIVE
Comment: NORMAL
Neisseria Gonorrhea: NEGATIVE

## 2023-01-02 LAB — CYTOLOGY, (ORAL, ANAL, URETHRAL) ANCILLARY ONLY
Chlamydia: NEGATIVE
Comment: NEGATIVE
Comment: NORMAL
Neisseria Gonorrhea: POSITIVE — AB

## 2023-01-09 ENCOUNTER — Ambulatory Visit (INDEPENDENT_AMBULATORY_CARE_PROVIDER_SITE_OTHER): Payer: Medicaid Other | Admitting: Pharmacist

## 2023-01-09 ENCOUNTER — Other Ambulatory Visit: Payer: Self-pay

## 2023-01-09 DIAGNOSIS — A549 Gonococcal infection, unspecified: Secondary | ICD-10-CM

## 2023-01-09 MED ORDER — CEFTRIAXONE SODIUM 500 MG IJ SOLR
500.0000 mg | Freq: Once | INTRAMUSCULAR | Status: DC
  Administered 2023-01-09: 500 mg via INTRAMUSCULAR

## 2023-01-09 NOTE — Progress Notes (Signed)
   01/09/2023  HPI: Beth George is a 27 y.o. female who presents to the [place] clinic today for Gonorrhea treatment.  Patient Active Problem List   Diagnosis Date Noted   Routine screening for STI (sexually transmitted infection) 11/04/2022   Vaccine counseling 11/04/2022   Leg edema 06/20/2021   Obesity (BMI 30-39.9) 06/20/2021   Trichomonal vaginitis in pregnancy 04/01/2019   Group B streptococcus urinary tract infection affecting pregnancy 01/05/2019   Supervision of high risk pregnancy, antepartum 12/30/2018   Anemia in pregnancy 05/19/2017   HIV disease ([place]) 03/05/2017    Patient's Medications  New Prescriptions   No medications on file  Previous Medications   CABOTEGRAVIR & RILPIVIRINE ER (CABENUVA) 600 & 900 MG/3ML INJECTION    Inject 1 kit into the muscle every 30 (thirty) days.   CABOTEGRAVIR & RILPIVIRINE ER (CABENUVA) 600 & 900 MG/3ML INJECTION    Inject 1 kit into the muscle every 2 (two) months.  Modified Medications   No medications on file  Discontinued Medications   No medications on file    Allergies: No Known Allergies  Past Medical History: Past Medical History:  Diagnosis Date   HIV disease affecting pregnancy 03/05/2017   CD4 586 On medication per med list [facility] at [facility] to [facility] ID.    Routine screening for STI (sexually transmitted infection) 11/04/2022   Vaccine counseling 11/04/2022    Social History: Social History   Socioeconomic History   Marital status: Single    Spouse name: Not on file   Number of children: Not on file   Years of education: Not on file   Highest education level: Not on file  Occupational History   Not on file  Tobacco Use   Smoking status: Never   Smokeless tobacco: Never  Vaping Use   Vaping Use: Never used  Substance and Sexual Activity   Alcohol use: Yes    Comment: weekends   Drug use: No   Sexual activity: Yes    Birth control/protection: None, Condom    Comment: declined  condoms  Other Topics Concern   Not on file  Social History Narrative   Not on file   Social Determinants of Health   Financial Resource Strain: Not on file  Food Insecurity: Not on file  Transportation Needs: Not on file  Physical Activity: Not on file  Stress: Not on file  Social Connections: Not on file    Assessment: Beth George presents today for treatment of gonorrhea after her oral swab resulted positive on 01/01/21. She does not report any symptoms such as throat dryness, itching or soreness today.  Administered Ceftriaxone 512m/2mL in left upper outer quadrant of the gluteal muscle. No issues with injections. She will follow up in 7-14 days for test of cure.   Plan: - Ceftriaxone injection administered - [facility] KTitus Dubin PharmD PGY1 Pharmacy Resident 01/09/2023 10:27 AM

## 2023-01-22 ENCOUNTER — Ambulatory Visit: Payer: Medicaid Other | Admitting: Infectious Disease

## 2023-01-23 ENCOUNTER — Ambulatory Visit (INDEPENDENT_AMBULATORY_CARE_PROVIDER_SITE_OTHER): Payer: Medicaid Other | Admitting: Pharmacist

## 2023-01-23 ENCOUNTER — Other Ambulatory Visit: Payer: Self-pay

## 2023-01-23 DIAGNOSIS — A549 Gonococcal infection, unspecified: Secondary | ICD-10-CM

## 2023-01-23 NOTE — Progress Notes (Signed)
HPI: Beth George is a 27 y.o. female who presents to the [place] clinic for gonorrhea oral test of cure.   Patient Active Problem List   Diagnosis Date Noted   Routine screening for STI (sexually transmitted infection) 11/04/2022   Vaccine counseling 11/04/2022   Leg edema 06/20/2021   Obesity (BMI 30-39.9) 06/20/2021   Trichomonal vaginitis in pregnancy 04/01/2019   Group B streptococcus urinary tract infection affecting pregnancy 01/05/2019   Supervision of high risk pregnancy, antepartum 12/30/2018   Anemia in pregnancy 05/19/2017   HIV disease ([place]) 03/05/2017    Patient's Medications  New Prescriptions   No medications on file  Previous Medications   CABOTEGRAVIR & RILPIVIRINE ER (CABENUVA) 600 & 900 MG/3ML INJECTION    Inject 1 kit into the muscle every 30 (thirty) days.   CABOTEGRAVIR & RILPIVIRINE ER (CABENUVA) 600 & 900 MG/3ML INJECTION    Inject 1 kit into the muscle every 2 (two) months.  Modified Medications   No medications on file  Discontinued Medications   No medications on file    Allergies: No Known Allergies  Past Medical History: Past Medical History:  Diagnosis Date   HIV disease affecting pregnancy 03/05/2017   CD4 586 On medication per med list Follow[facility] at [facility] to [facility] ID.    Routine screening for STI (sexually transmitted infection) 11/04/2022   Vaccine counseling 11/04/2022    Social History: Social History   Socioeconomic History   Marital status: Single    Spouse name: Not on file   Number of children: Not on file   Years of education: Not on file   Highest education level: Not on file  Occupational History   Not on file  Tobacco Use   Smoking status: Never   Smokeless tobacco: Never  Vaping Use   Vaping Use: Never used  Substance and Sexual Activity   Alcohol use: Yes    Comment: weekends   Drug use: No   Sexual activity: Yes    Birth control/protection: None, Condom    Comment: declined  condoms  Other Topics Concern   Not on file  Social History Narrative   Not on file   Social Determinants of Health   Financial Resource Strain: Not on file  Food Insecurity: Not on file  Transportation Needs: Not on file  Physical Activity: Not on file  Stress: Not on file  Social Connections: Not on file    Labs: Lab Results  Component Value Date   HIV1RNAQUANT Not Detected 11/04/2022   HIV1RNAQUANT Not Detected 07/04/2022   HIV1RNAQUANT NOT DETECTED 02/27/2022   CD4TABS 844 11/04/2022   CD4TABS 749 06/20/2021    RPR and STI Lab Results  Component Value Date   LABRPR NON-REACTIVE 11/04/2022   LABRPR NON-REACTIVE 07/04/2022   LABRPR NON-REACTIVE 01/02/2022   LABRPR NON-REACTIVE 06/20/2021   LABRPR NON-REACTIVE 03/29/2020    STI Results GC CT  01/01/2023 10:25 AM Positive    Negative  Negative    Negative   11/04/2022 10:39 AM Negative  Negative   07/04/2022 11:34 AM Negative  Negative   10/03/2021  9:54 AM Negative  Negative   12/30/2018 12:00 AM Negative  Negative   06/30/2017 12:00 AM Negative  Negative   03/17/2017 12:00 AM Negative  Negative     Hepatitis B Lab Results  Component Value Date   HEPBSAB NON-REACTIVE 01/02/2022   HEPBSAG Negative 12/30/2018   Hepatitis C No results found for: "HEPCAB", "HCVRNAPCRQN" Hepatitis A No results  found for: "HAV" Lipids: Lab Results  Component Value Date   CHOL 162 11/04/2022   TRIG 44 11/04/2022   HDL 53 11/04/2022   CHOLHDL 3.1 11/04/2022   LDLCALC 96 11/04/2022    Assessment: Beth George presents today for gonorrhea test of cure. She was treated with ceftriaxone '500mg'$ /47m on 01/09/23 and tolerated it well. An oral cytology lab was collected today for confirmation of gonorrhea cure. Patient was told that "no news is good news" and we will be in touch with her if the lab results as positive--she acknowledged understanding.    Plan: - Collect oral cytology and f/u results  - Next appointments scheduled  02/26/23 with SColletta [place] 05/07/23 with Dr. VTommy Medal and 07/03/23 with AEstill Bamberg- Call with any issues or questions   SBilley Gosling PharmD PGY1 Pharmacy Resident 3/8/202412:43 PM

## 2023-01-24 LAB — GC/CHLAMYDIA PROBE, AMP (THROAT)
Chlamydia trachomatis RNA: NOT DETECTED
Neisseria gonorrhoeae RNA: NOT DETECTED

## 2023-02-19 ENCOUNTER — Other Ambulatory Visit (HOSPITAL_COMMUNITY): Payer: Self-pay

## 2023-02-20 ENCOUNTER — Other Ambulatory Visit: Payer: Self-pay

## 2023-02-23 ENCOUNTER — Telehealth: Payer: Self-pay

## 2023-02-23 NOTE — Telephone Encounter (Signed)
[facility] Patient Advocate Encounter  Patient's medication Renaldo Harrison) have been couriered to [facility] from [employer] and will be administered on the patient next office visit on 02/26/23.  [name] , CPhT Specialty Pharmacy Patient [facility] for Infectious Disease Phone: [phone] Fax:  [phone]

## 2023-02-26 ENCOUNTER — Ambulatory Visit: Payer: Medicaid Other | Admitting: Infectious Diseases

## 2023-02-26 ENCOUNTER — Telehealth: Payer: Self-pay

## 2023-02-26 NOTE — Telephone Encounter (Signed)
Called patient regarding missed appointment for PAP+ Cab. Left voicemail requesting call back. Patient TD is the 17th.  Juanita Laster, RMA

## 2023-02-27 NOTE — Telephone Encounter (Signed)
Second voicemail left requesting call back to reschedule appointment.

## 2023-03-02 DIAGNOSIS — H5213 Myopia, bilateral: Secondary | ICD-10-CM | POA: Diagnosis not present

## 2023-03-03 ENCOUNTER — Ambulatory Visit (INDEPENDENT_AMBULATORY_CARE_PROVIDER_SITE_OTHER): Payer: Medicaid Other | Admitting: Pharmacist

## 2023-03-03 ENCOUNTER — Other Ambulatory Visit: Payer: Self-pay

## 2023-03-03 DIAGNOSIS — B20 Human immunodeficiency virus [HIV] disease: Secondary | ICD-10-CM

## 2023-03-03 DIAGNOSIS — Z1159 Encounter for screening for other viral diseases: Secondary | ICD-10-CM

## 2023-03-03 DIAGNOSIS — Z23 Encounter for immunization: Secondary | ICD-10-CM

## 2023-03-03 MED ORDER — CABOTEGRAVIR & RILPIVIRINE ER 600 & 900 MG/3ML IM SUER
1.0000 | Freq: Once | INTRAMUSCULAR | Status: AC
  Administered 2023-03-03: 1 via INTRAMUSCULAR

## 2023-03-03 NOTE — Progress Notes (Signed)
HPI: Beth George is a 27 y.o. female who presents to the [facility] pharmacy clinic for [place] administration.  Patient Active Problem List   Diagnosis Date Noted   Routine screening for STI (sexually transmitted infection) 11/04/2022   Vaccine counseling 11/04/2022   Leg edema 06/20/2021   Obesity (BMI 30-39.9) 06/20/2021   Trichomonal vaginitis in pregnancy 04/01/2019   Group B streptococcus urinary tract infection affecting pregnancy 01/05/2019   Supervision of high risk pregnancy, antepartum 12/30/2018   Anemia in pregnancy 05/19/2017   HIV disease 03/05/2017    Patient's Medications  New Prescriptions   No medications on file  Previous Medications   CABOTEGRAVIR & RILPIVIRINE ER (CABENUVA) 600 & 900 MG/3ML INJECTION    Inject 1 kit into the muscle every 30 (thirty) days.   CABOTEGRAVIR & RILPIVIRINE ER (CABENUVA) 600 & 900 MG/3ML INJECTION    Inject 1 kit into the muscle every 2 (two) months.  Modified Medications   No medications on file  Discontinued Medications   No medications on file    Allergies: No Known Allergies  Past Medical History: Past Medical History:  Diagnosis Date   HIV disease affecting pregnancy 03/05/2017   CD4 586 On medication per med list Followed at [facility] to [facility] ID.    Routine screening for STI (sexually transmitted infection) 11/04/2022   Vaccine counseling 11/04/2022    Social History: Social History   Socioeconomic History   Marital status: Single    Spouse name: Not on file   Number of children: Not on file   Years of education: Not on file   Highest education level: Not on file  Occupational History   Not on file  Tobacco Use   Smoking status: Never   Smokeless tobacco: Never  Vaping Use   Vaping Use: Never used  Substance and Sexual Activity   Alcohol use: Yes    Comment: weekends   Drug use: No   Sexual activity: Yes    Birth control/protection: None, Condom    Comment: declined condoms   Other Topics Concern   Not on file  Social History Narrative   Not on file   Social Determinants of Health   Financial Resource Strain: Not on file  Food Insecurity: Not on file  Transportation Needs: Not on file  Physical Activity: Not on file  Stress: Not on file  Social Connections: Not on file    Labs: Lab Results  Component Value Date   HIV1RNAQUANT Not Detected 11/04/2022   HIV1RNAQUANT Not Detected 07/04/2022   HIV1RNAQUANT NOT DETECTED 02/27/2022   CD4TABS 844 11/04/2022   CD4TABS 749 06/20/2021    RPR and STI Lab Results  Component Value Date   LABRPR NON-REACTIVE 11/04/2022   LABRPR NON-REACTIVE 07/04/2022   LABRPR NON-REACTIVE 01/02/2022   LABRPR NON-REACTIVE 06/20/2021   LABRPR NON-REACTIVE 03/29/2020    STI Results GC CT  01/01/2023 10:25 AM Positive    Negative  Negative    Negative   11/04/2022 10:39 AM Negative  Negative   07/04/2022 11:34 AM Negative  Negative   10/03/2021  9:54 AM Negative  Negative   12/30/2018 12:00 AM Negative  Negative   06/30/2017 12:00 AM Negative  Negative   03/17/2017 12:00 AM Negative  Negative     Hepatitis B Lab Results  Component Value Date   HEPBSAB NON-REACTIVE 01/02/2022   HEPBSAG Negative 12/30/2018   Hepatitis C No results found for: "HEPCAB", "HCVRNAPCRQN" Hepatitis A No results found for: "HAV" Lipids: Lab  Results  Component Value Date   CHOL 162 11/04/2022   TRIG 44 11/04/2022   HDL 53 11/04/2022   CHOLHDL 3.1 11/04/2022   LDLCALC 96 11/04/2022    TARGET DATE: The 17th   Assessment: Kaity presents today for her maintenance Cabenuva injections. Past injections were tolerated well without issues.  Administered cabotegravir /94mL in left upper outer quadrant of the gluteal muscle. Administered rilpivirine 900 mg/26mL in the right upper outer quadrant of the gluteal muscle. No issues with injections. She will follow up in 2 months for next set of injections.  Lesbia is up to date on  her vaccines. I would have liked to measure an HIV RNA and hepatitis A, B, and C antibodies, but patient did not have time for labs today. Will defer to next time. She declines STI testing today.   Plan: - Cabenuva injections administered - Next injections scheduled for June 20th, 2024 with Dr. Daiva Eves and then August 20th, 2024 with [name]  - Follow up to complete hepatitis A, B, and C antibody and HIV RNA check at next visit  - Call with any issues or questions  Blane Ohara, PharmD  PGY1 Pharmacy Resident

## 2023-03-16 ENCOUNTER — Other Ambulatory Visit: Payer: Self-pay

## 2023-03-18 ENCOUNTER — Other Ambulatory Visit: Payer: Self-pay

## 2023-04-24 ENCOUNTER — Other Ambulatory Visit (HOSPITAL_COMMUNITY): Payer: Self-pay

## 2023-04-29 ENCOUNTER — Other Ambulatory Visit (HOSPITAL_COMMUNITY): Payer: Self-pay

## 2023-04-30 ENCOUNTER — Telehealth: Payer: Self-pay

## 2023-04-30 NOTE — Telephone Encounter (Signed)
[facility] Patient Advocate Encounter  Patient's medication Renaldo Harrison) have been couriered to [facility] from [employer] and will be administered on the patient next office visit on 05/07/23.  [name] , CPhT Specialty Pharmacy Patient [facility] for Infectious Disease Phone: [phone] Fax:  [phone]

## 2023-05-07 ENCOUNTER — Encounter: Payer: Medicaid Other | Admitting: Infectious Disease

## 2023-05-11 ENCOUNTER — Encounter: Payer: Self-pay | Admitting: Pharmacist

## 2023-05-14 ENCOUNTER — Other Ambulatory Visit: Payer: Self-pay

## 2023-05-14 ENCOUNTER — Ambulatory Visit (INDEPENDENT_AMBULATORY_CARE_PROVIDER_SITE_OTHER): Payer: Medicaid Other | Admitting: Pharmacist

## 2023-05-14 DIAGNOSIS — B20 Human immunodeficiency virus [HIV] disease: Secondary | ICD-10-CM | POA: Diagnosis not present

## 2023-05-14 MED ORDER — CABOTEGRAVIR & RILPIVIRINE ER 600 & 900 MG/3ML IM SUER
1.0000 | Freq: Once | INTRAMUSCULAR | Status: AC
Start: 2023-05-14 — End: 2023-05-14
  Administered 2023-05-14: 1 via INTRAMUSCULAR

## 2023-05-14 NOTE — Progress Notes (Signed)
HPI: Beth George is a 27 y.o. female who presents to the [facility] pharmacy clinic for [place] administration.  Patient Active Problem List   Diagnosis Date Noted   Routine screening for STI (sexually transmitted infection) 11/04/2022   Vaccine counseling 11/04/2022   Leg edema 06/20/2021   Obesity (BMI 30-39.9) 06/20/2021   Trichomonal vaginitis in pregnancy 04/01/2019   Group B streptococcus urinary tract infection affecting pregnancy 01/05/2019   Supervision of high risk pregnancy, antepartum 12/30/2018   Anemia in pregnancy 05/19/2017   HIV disease ([facility]) 03/05/2017    Patient's Medications  New Prescriptions   No medications on file  Previous Medications   CABOTEGRAVIR & RILPIVIRINE ER (CABENUVA) 600 & 900 MG/3ML INJECTION    Inject 1 kit into the muscle every 30 (thirty) days.   CABOTEGRAVIR & RILPIVIRINE ER (CABENUVA) 600 & 900 MG/3ML INJECTION    Inject 1 kit into the muscle every 2 (two) months.  Modified Medications   No medications on file  Discontinued Medications   No medications on file    Allergies: No Known Allergies  Past Medical History: Past Medical History:  Diagnosis Date   HIV disease affecting pregnancy 03/05/2017   CD4 586 On medication per med list Followed at [facility] to [facility] ID.    Routine screening for STI (sexually transmitted infection) 11/04/2022   Vaccine counseling 11/04/2022    Social History: Social History   Socioeconomic History   Marital status: Single    Spouse name: Not on file   Number of children: Not on file   Years of education: Not on file   Highest education level: Not on file  Occupational History   Not on file  Tobacco Use   Smoking status: Never   Smokeless tobacco: Never  Vaping Use   Vaping Use: Never used  Substance and Sexual Activity   Alcohol use: Yes    Comment: weekends   Drug use: No   Sexual activity: Yes    Birth control/protection: None, Condom    Comment: declined  condoms  Other Topics Concern   Not on file  Social History Narrative   Not on file   Social Determinants of Health   Financial Resource Strain: Not on file  Food Insecurity: Not on file  Transportation Needs: Not on file  Physical Activity: Not on file  Stress: Not on file  Social Connections: Not on file    Labs: Lab Results  Component Value Date   HIV1RNAQUANT Not Detected 11/04/2022   HIV1RNAQUANT Not Detected 07/04/2022   HIV1RNAQUANT NOT DETECTED 02/27/2022   CD4TABS 844 11/04/2022   CD4TABS 749 06/20/2021    RPR and STI Lab Results  Component Value Date   LABRPR NON-REACTIVE 11/04/2022   LABRPR NON-REACTIVE 07/04/2022   LABRPR NON-REACTIVE 01/02/2022   LABRPR NON-REACTIVE 06/20/2021   LABRPR NON-REACTIVE 03/29/2020    STI Results GC CT  01/01/2023 10:25 AM Positive    Negative  Negative    Negative   11/04/2022 10:39 AM Negative  Negative   07/04/2022 11:34 AM Negative  Negative   10/03/2021  9:54 AM Negative  Negative   12/30/2018 12:00 AM Negative  Negative   06/30/2017 12:00 AM Negative  Negative   03/17/2017 12:00 AM Negative  Negative     Hepatitis B Lab Results  Component Value Date   HEPBSAB NON-REACTIVE 01/02/2022   HEPBSAG Negative 12/30/2018   Hepatitis C No results found for: "HEPCAB", "HCVRNAPCRQN" Hepatitis A No results found for: "HAV" Lipids:  Lab Results  Component Value Date   CHOL 162 11/04/2022   TRIG 44 11/04/2022   HDL 53 11/04/2022   CHOLHDL 3.1 11/04/2022   LDLCALC 96 11/04/2022    TARGET DATE:  The 17th of the month (out of window today)   Current HIV Regimen: Cabenuva  Assessment: Beth George presents today for their maintenance Cabenuva injections. Initial/past injections were tolerated well without issues. No problems with systemic effects of injections.   Administered cabotegravir 600mg /9mL in left upper outer quadrant of the gluteal muscle. Administered rilpivirine 900 mg/50mL in the right upper outer quadrant  of the gluteal muscle. Monitored patient for 10 minutes after injection. Injections were tolerated well without issue. Patient will follow up in 2 months for next injection. Will check HIV RNA today.  Patient missed appointment with Beth George last week and is now outside of her target window by 4 days. Explained that this is not ideal given her Cabenuva levels are somewhat lower than desired today but reviewed that I can still administer her Cabenuva. Reminded her about the importance of remembering her appointments and getting her injections on time; she verbalized understanding.  She denies any new or recent sexual partners and politely declines STI testing. Currently up-to-date on vaccines since she refuses the the updated COVID vaccine. Continues to miss pap clinic appointments with Beth George; rescheduled a pap smear appointment with Beth George the same day she comes in for her Cabenuva in October with me.   Plan: - Cabenuva injections administered - Check HIV RNA - Next injections scheduled for 8/19 with Beth George and 10/18 with me - Pap clinic with Beth George on 10/18  - Call with any issues or questions  Beth George, PharmD, CPP, BCIDP, AAHIVP Clinical Pharmacist Practitioner Infectious Diseases Clinical Pharmacist [facility]

## 2023-05-16 LAB — HIV-1 RNA QUANT-NO REFLEX-BLD
HIV 1 RNA Quant: NOT DETECTED Copies/mL
HIV-1 RNA Quant, Log: NOT DETECTED Log cps/mL

## 2023-06-23 ENCOUNTER — Other Ambulatory Visit (HOSPITAL_COMMUNITY): Payer: Self-pay

## 2023-06-30 ENCOUNTER — Other Ambulatory Visit: Payer: Self-pay

## 2023-07-01 ENCOUNTER — Other Ambulatory Visit: Payer: Self-pay

## 2023-07-02 ENCOUNTER — Telehealth: Payer: Self-pay | Admitting: Pharmacy Technician

## 2023-07-02 NOTE — Telephone Encounter (Signed)
[facility] Patient Advocate Encounter  Patient's medication, Renaldo Harrison, has been couriered to [facility] from [employer] and will be administered at pt's next visit  07/06/23.

## 2023-07-03 ENCOUNTER — Encounter: Payer: Medicaid Other | Admitting: Pharmacist

## 2023-07-05 NOTE — Progress Notes (Deleted)
Subjective:  Chief complaint: For HIV disease on Cabenuva injections  Patient ID: Beth George, female    DOB: Mar 03, 1996, 27 y.o.   MRN: [id number]  HPI  Beth George is a 27 year old Black woman with HIV whom we used to see in [place] clinic before it was close down now transferred care over to our [facility] who has been on Cabenuva injections with nice virological suppression.  She is in need of a Pap smear of asked her to make an appointment with Judeth Cornfield for this.    Past Medical History:  Diagnosis Date  . HIV disease affecting pregnancy 03/05/2017   CD4 586 On medication per med list Followed at [facility] to [facility] ID.   Marland Kitchen Routine screening for STI (sexually transmitted infection) 11/04/2022  . Vaccine counseling 11/04/2022    No past surgical history on file.  Family History  Problem Relation Age of Onset  . Cancer Father   . ADD / ADHD Mother   . Asthma Mother   . HIV Mother   . Anxiety disorder Mother       Social History   Socioeconomic History  . Marital status: Single    Spouse name: Not on file  . Number of children: Not on file  . Years of education: Not on file  . Highest education level: Not on file  Occupational History  . Not on file  Tobacco Use  . Smoking status: Never  . Smokeless tobacco: Never  Vaping Use  . Vaping status: Never Used  Substance and Sexual Activity  . Alcohol use: Yes    Comment: weekends  . Drug use: No  . Sexual activity: Yes    Birth control/protection: None, Condom    Comment: declined condoms  Other Topics Concern  . Not on file  Social History Narrative  . Not on file   Social Determinants of Health   Financial Resource Strain: Not on file  Food Insecurity: Not on file  Transportation Needs: Not on file  Physical Activity: Not on file  Stress: Not on file  Social Connections: Not on file    No Known Allergies   Current Outpatient Medications:  .  cabotegravir & rilpivirine ER  (CABENUVA) 600 & 900 MG/3ML injection, Inject 1 kit into the muscle every 30 (thirty) days., Disp: 6 mL, Rfl: 1 .  cabotegravir & rilpivirine ER (CABENUVA) 600 & 900 MG/3ML injection, Inject 1 kit into the muscle every 2 (two) months., Disp: 6 mL, Rfl: 5    Review of Systems      Objective:   Physical Exam Constitutional:      General: She is not in acute distress.    Appearance: Normal appearance. She is well-developed. She is not ill-appearing or diaphoretic.  HENT:     Head: Normocephalic and atraumatic.     Right Ear: Hearing and external ear normal.     Left Ear: Hearing and external ear normal.     Nose: No nasal deformity or rhinorrhea.  Eyes:     General: No scleral icterus.    Conjunctiva/sclera: Conjunctivae normal.     Right eye: Right conjunctiva is not injected.     Left eye: Left conjunctiva is not injected.     Pupils: Pupils are equal, round, and reactive to light.  Neck:     Vascular: No JVD.  Cardiovascular:     Rate and Rhythm: Normal rate and regular rhythm.     Heart sounds: Normal heart sounds, S1  normal and S2 normal. No murmur heard.    No friction rub.  Abdominal:     General: Bowel sounds are normal. There is no distension.     Palpations: Abdomen is soft.     Tenderness: There is no abdominal tenderness.  Musculoskeletal:        General: Normal range of motion.     Right shoulder: Normal.     Left shoulder: Normal.     Cervical back: Normal range of motion and neck supple.     Right hip: Normal.     Left hip: Normal.     Right knee: Normal.     Left knee: Normal.  Lymphadenopathy:     Head:     Right side of head: No submandibular, preauricular or posterior auricular adenopathy.     Left side of head: No submandibular, preauricular or posterior auricular adenopathy.     Cervical: No cervical adenopathy.     Right cervical: No superficial or deep cervical adenopathy.    Left cervical: No superficial or deep cervical adenopathy.  Skin:     General: Skin is warm and dry.     Coloration: Skin is not pale.     Findings: No abrasion, bruising, ecchymosis, erythema, lesion or rash.     Nails: There is no clubbing.  Neurological:     Mental Status: She is alert and oriented to person, place, and time.     Sensory: No sensory deficit.     Coordination: Coordination normal.     Gait: Gait normal.  Psychiatric:        Attention and Perception: She is attentive.        Mood and Affect: Mood normal.        Speech: Speech normal.        Behavior: Behavior normal. Behavior is cooperative.        Thought Content: Thought content normal.        Judgment: Judgment normal.          Assessment & Plan:   HIV disease:  I will add order HIV viral load CD4 count CBC with differential CMP, RPR GC and chlamydia and I will continue  Kennya L Pullara's Cabenuva prescription which she has 2 injections today.  STI screen we will screen for gonorrhea and chlamydia.  History of abnormal Pap smear of asked her to see Judeth Cornfield for this.  Vaccine counseling recommended flu and COVID vaccines which she refused

## 2023-07-06 ENCOUNTER — Encounter: Payer: Medicaid Other | Admitting: Infectious Disease

## 2023-07-06 DIAGNOSIS — Z7185 Encounter for immunization safety counseling: Secondary | ICD-10-CM

## 2023-07-06 DIAGNOSIS — B20 Human immunodeficiency virus [HIV] disease: Secondary | ICD-10-CM

## 2023-07-06 DIAGNOSIS — Z113 Encounter for screening for infections with a predominantly sexual mode of transmission: Secondary | ICD-10-CM

## 2023-07-07 ENCOUNTER — Encounter: Payer: Medicaid Other | Admitting: Pharmacist

## 2023-07-08 NOTE — Progress Notes (Signed)
   HPI: Beth George is a 27 y.o. female who presents to the [facility] pharmacy clinic for [place] administration.  Patient Active Problem List   Diagnosis Date Noted   Routine screening for STI (sexually transmitted infection) 11/04/2022   Vaccine counseling 11/04/2022   Leg edema 06/20/2021   Obesity (BMI 30-39.9) 06/20/2021   Trichomonal vaginitis in pregnancy 04/01/2019   Group B streptococcus urinary tract infection affecting pregnancy 01/05/2019   Supervision of high risk pregnancy, antepartum 12/30/2018   Anemia in pregnancy 05/19/2017   HIV disease ([facility]) 03/05/2017    Patient's Medications  New Prescriptions   No medications on file  Previous Medications   CABOTEGRAVIR & RILPIVIRINE ER (CABENUVA) 600 & 900 MG/3ML INJECTION    Inject 1 kit into the muscle every 30 (thirty) days.   CABOTEGRAVIR & RILPIVIRINE ER (CABENUVA) 600 & 900 MG/3ML INJECTION    Inject 1 kit into the muscle every 2 (two) months.  Modified Medications   No medications on file  Discontinued Medications   No medications on file    Allergies: No Known Allergies  Labs: Lab Results  Component Value Date   HIV1RNAQUANT Not Detected 05/14/2023   HIV1RNAQUANT Not Detected 11/04/2022   HIV1RNAQUANT Not Detected 07/04/2022   CD4TABS 844 11/04/2022   CD4TABS 749 06/20/2021    RPR and STI Lab Results  Component Value Date   LABRPR NON-REACTIVE 11/04/2022   LABRPR NON-REACTIVE 07/04/2022   LABRPR NON-REACTIVE 01/02/2022   LABRPR NON-REACTIVE 06/20/2021   LABRPR NON-REACTIVE 03/29/2020    STI Results GC CT  01/01/2023 10:25 AM Positive    Negative  Negative    Negative   11/04/2022 10:39 AM Negative  Negative   07/04/2022 11:34 AM Negative  Negative   10/03/2021  9:54 AM Negative  Negative   12/30/2018 12:00 AM Negative  Negative   06/30/2017 12:00 AM Negative  Negative   03/17/2017 12:00 AM Negative  Negative     Hepatitis B Lab Results  Component Value Date   HEPBSAB NON-REACTIVE  01/02/2022   HEPBSAG Negative 12/30/2018   Hepatitis C No results found for: "HEPCAB", "HCVRNAPCRQN" Hepatitis A No results found for: "HAV" Lipids: Lab Results  Component Value Date   CHOL 162 11/04/2022   TRIG 44 11/04/2022   HDL 53 11/04/2022   CHOLHDL 3.1 11/04/2022   LDLCALC 96 11/04/2022    TARGET DATE: The 17th of the month  Assessment: Beth George presents today for her maintenance Cabenuva injections. Past injections were tolerated well without issues. Educated Beth George about the importance of coming to appointments on time and in her window. She did sign a Patent examiner today.   Administered cabotegravir 600mg /35mL in left upper outer quadrant of the gluteal muscle. Administered rilpivirine 900 mg/29mL in the right upper outer quadrant of the gluteal muscle. No issues with injections. She will follow up in 2 months for next set of injections.  Will check Hepatitis A, B and C antibodies today. She has been vaccinated for hepatitis B, so want to ensure immunity. No history of checking for hepatitis A or C in her chart. She is agreeable to these labs today. She politely declined STI testing today.  Plan: - Cabenuva injections administered - Obtain hepatitis A, B and C antibodies today - Next injections scheduled for 10/18 with [name] and 12/10 with Dr. Daiva Eves - Call with any issues or questions  Lennie Muckle, PharmD PGY1 Pharmacy Resident 07/08/2023 2:34 PM

## 2023-07-09 ENCOUNTER — Ambulatory Visit (INDEPENDENT_AMBULATORY_CARE_PROVIDER_SITE_OTHER): Payer: Medicaid Other | Admitting: Pharmacist

## 2023-07-09 ENCOUNTER — Other Ambulatory Visit: Payer: Self-pay

## 2023-07-09 DIAGNOSIS — Z1159 Encounter for screening for other viral diseases: Secondary | ICD-10-CM

## 2023-07-09 DIAGNOSIS — Z23 Encounter for immunization: Secondary | ICD-10-CM

## 2023-07-09 DIAGNOSIS — B20 Human immunodeficiency virus [HIV] disease: Secondary | ICD-10-CM | POA: Diagnosis present

## 2023-07-09 MED ORDER — CABOTEGRAVIR & RILPIVIRINE ER 600 & 900 MG/3ML IM SUER
1.0000 | Freq: Once | INTRAMUSCULAR | Status: AC
Start: 2023-07-09 — End: 2023-07-09
  Administered 2023-07-09: 1 via INTRAMUSCULAR

## 2023-07-10 LAB — HEPATITIS B SURFACE ANTIBODY,QUALITATIVE: Hep B S Ab: REACTIVE — AB

## 2023-07-10 LAB — HEPATITIS A ANTIBODY, TOTAL: Hepatitis A AB,Total: NONREACTIVE

## 2023-07-10 LAB — HEPATITIS C ANTIBODY: Hepatitis C Ab: NONREACTIVE

## 2023-08-20 ENCOUNTER — Other Ambulatory Visit: Payer: Self-pay

## 2023-08-20 ENCOUNTER — Other Ambulatory Visit (HOSPITAL_COMMUNITY): Payer: Self-pay

## 2023-08-20 NOTE — Progress Notes (Signed)
Specialty Pharmacy Refill Coordination Note  Beth George is a 28 y.o. female assessed today regarding refills of clinic administered specialty medication(s) Cabotegravir & Rilpivirine   Clinic requested Courier to Provider Office   Delivery date: 08/25/23   Verified address: [facility] [address] [place] [address]   Medication will be filled on 08/24/23.

## 2023-08-24 ENCOUNTER — Other Ambulatory Visit: Payer: Self-pay

## 2023-08-25 ENCOUNTER — Telehealth: Payer: Self-pay

## 2023-08-25 NOTE — Telephone Encounter (Signed)
[facility] Patient Advocate Encounter  Patient's medications (CABENUVA) have been couriered to [facility] from [facility] pharmacy and will be administered at the patients appointment on 09/04/23.  Kae Heller, CPhT Specialty Pharmacy Patient [facility] for Infectious Disease Phone: [phone] Fax:  [phone]

## 2023-08-31 NOTE — Progress Notes (Deleted)
HPI: Beth George is a 27 y.o. female who presents to the [facility] pharmacy clinic for [place] administration.  Patient Active Problem List   Diagnosis Date Noted   Routine screening for STI (sexually transmitted infection) 11/04/2022   Vaccine counseling 11/04/2022   Leg edema 06/20/2021   Obesity (BMI 30-39.9) 06/20/2021   Trichomonal vaginitis in pregnancy 04/01/2019   Group B streptococcus urinary tract infection affecting pregnancy 01/05/2019   Supervision of high risk pregnancy, antepartum 12/30/2018   Anemia in pregnancy 05/19/2017   HIV disease (HCC) 03/05/2017    Patient's Medications  New Prescriptions   No medications on file  Previous Medications   CABOTEGRAVIR & RILPIVIRINE ER (CABENUVA) 600 & 900 MG/3ML INJECTION    Inject 1 kit into the muscle every 2 (two) months.  Modified Medications   No medications on file  Discontinued Medications   No medications on file    Allergies: No Known Allergies  Past Medical History: Past Medical History:  Diagnosis Date   HIV disease affecting pregnancy 03/05/2017   CD4 586 On medication per med list Followed at [facility] to [facility] ID.    Routine screening for STI (sexually transmitted infection) 11/04/2022   Vaccine counseling 11/04/2022    Social History: Social History   Socioeconomic History   Marital status: Single    Spouse name: Not on file   Number of children: Not on file   Years of education: Not on file   Highest education level: Not on file  Occupational History   Not on file  Tobacco Use   Smoking status: Never   Smokeless tobacco: Never  Vaping Use   Vaping status: Never Used  Substance and Sexual Activity   Alcohol use: Yes    Comment: weekends   Drug use: No   Sexual activity: Yes    Birth control/protection: None, Condom    Comment: declined condoms  Other Topics Concern   Not on file  Social History Narrative   Not on file   Social Determinants of Health    Financial Resource Strain: Not on file  Food Insecurity: Not on file  Transportation Needs: Not on file  Physical Activity: Not on file  Stress: Not on file  Social Connections: Not on file    Labs: Lab Results  Component Value Date   HIV1RNAQUANT Not Detected 05/14/2023   HIV1RNAQUANT Not Detected 11/04/2022   HIV1RNAQUANT Not Detected 07/04/2022   CD4TABS 844 11/04/2022   CD4TABS 749 06/20/2021    RPR and STI Lab Results  Component Value Date   LABRPR NON-REACTIVE 11/04/2022   LABRPR NON-REACTIVE 07/04/2022   LABRPR NON-REACTIVE 01/02/2022   LABRPR NON-REACTIVE 06/20/2021   LABRPR NON-REACTIVE 03/29/2020    STI Results GC CT  01/01/2023 10:25 AM Positive    Negative  Negative    Negative   11/04/2022 10:39 AM Negative  Negative   07/04/2022 11:34 AM Negative  Negative   10/03/2021  9:54 AM Negative  Negative   12/30/2018 12:00 AM Negative  Negative   06/30/2017 12:00 AM Negative  Negative   03/17/2017 12:00 AM Negative  Negative     Hepatitis B Lab Results  Component Value Date   HEPBSAB REACTIVE (A) 07/09/2023   HEPBSAG Negative 12/30/2018   Hepatitis C Lab Results  Component Value Date   HEPCAB NON-REACTIVE 07/09/2023   Hepatitis A Lab Results  Component Value Date   HAV NON-REACTIVE 07/09/2023   Lipids: Lab Results  Component Value Date   CHOL  162 11/04/2022   TRIG 44 11/04/2022   HDL 53 11/04/2022   CHOLHDL 3.1 11/04/2022   LDLCALC 96 11/04/2022    TARGET DATE:  The 17th of the month  Assessment: Beth George presents today for their maintenance Cabenuva injections. Initial/past injections were tolerated well without issues. No problems with systemic effects of injections.   Administered cabotegravir 600mg /34mL in left upper outer quadrant of the gluteal muscle. Administered rilpivirine 900 mg/52mL in the right upper outer quadrant of the gluteal muscle. Monitored patient for 10 minutes after injection. Injections were tolerated well without  issue. Patient will follow up in 2 months for next injection. Due for all routine labs at next visit with Dr. Daiva Eves.  Declines all vaccines and STI testing today.  Plan: - Cabenuva injections administered - Follow up with Judeth Cornfield today for pap smear - Next injections scheduled for 12/12 with Dr. Daiva Eves and *** with me  - Call with any issues or questions  Margarite Gouge, PharmD, CPP, BCIDP, AAHIVP Clinical Pharmacist Practitioner Infectious Diseases Clinical Pharmacist [facility]

## 2023-09-04 ENCOUNTER — Ambulatory Visit: Payer: Medicaid Other | Admitting: Pharmacist

## 2023-09-04 ENCOUNTER — Ambulatory Visit: Payer: Medicaid Other | Admitting: Infectious Diseases

## 2023-09-04 DIAGNOSIS — B20 Human immunodeficiency virus [HIV] disease: Secondary | ICD-10-CM

## 2023-09-07 ENCOUNTER — Other Ambulatory Visit (HOSPITAL_COMMUNITY)
Admission: RE | Admit: 2023-09-07 | Discharge: 2023-09-07 | Disposition: A | Discharge status: Home / Self Care | Payer: Medicaid Other | Source: Ambulatory Visit | Attending: Infectious Disease | Admitting: Infectious Disease

## 2023-09-07 ENCOUNTER — Ambulatory Visit (INDEPENDENT_AMBULATORY_CARE_PROVIDER_SITE_OTHER): Payer: Medicaid Other | Admitting: Pharmacist

## 2023-09-07 ENCOUNTER — Other Ambulatory Visit: Payer: Self-pay

## 2023-09-07 DIAGNOSIS — Z113 Encounter for screening for infections with a predominantly sexual mode of transmission: Secondary | ICD-10-CM

## 2023-09-07 DIAGNOSIS — Z79899 Other long term (current) drug therapy: Secondary | ICD-10-CM

## 2023-09-07 DIAGNOSIS — B20 Human immunodeficiency virus [HIV] disease: Secondary | ICD-10-CM

## 2023-09-07 MED ORDER — CABOTEGRAVIR & RILPIVIRINE ER 600 & 900 MG/3ML IM SUER
1.0000 | Freq: Once | INTRAMUSCULAR | Status: AC
  Administered 2023-09-07: 1 via INTRAMUSCULAR

## 2023-09-07 NOTE — Progress Notes (Signed)
HPI: Beth George is a 27 y.o. female who presents to the [facility] pharmacy clinic for [place] administration.  Patient Active Problem List   Diagnosis Date Noted   Routine screening for STI (sexually transmitted infection) 11/04/2022   Vaccine counseling 11/04/2022   Leg edema 06/20/2021   Obesity (BMI 30-39.9) 06/20/2021   Trichomonal vaginitis in pregnancy 04/01/2019   Group B streptococcus urinary tract infection affecting pregnancy 01/05/2019   Supervision of high risk pregnancy, antepartum 12/30/2018   Anemia in pregnancy 05/19/2017   HIV disease (HCC) 03/05/2017    Patient's Medications  New Prescriptions   No medications on file  Previous Medications   CABOTEGRAVIR & RILPIVIRINE ER (CABENUVA) 600 & 900 MG/3ML INJECTION    Inject 1 kit into the muscle every 2 (two) months.  Modified Medications   No medications on file  Discontinued Medications   No medications on file    Allergies: No Known Allergies  Labs: Lab Results  Component Value Date   HIV1RNAQUANT Not Detected 05/14/2023   HIV1RNAQUANT Not Detected 11/04/2022   HIV1RNAQUANT Not Detected 07/04/2022   CD4TABS 844 11/04/2022   CD4TABS 749 06/20/2021    RPR and STI Lab Results  Component Value Date   LABRPR NON-REACTIVE 11/04/2022   LABRPR NON-REACTIVE 07/04/2022   LABRPR NON-REACTIVE 01/02/2022   LABRPR NON-REACTIVE 06/20/2021   LABRPR NON-REACTIVE 03/29/2020    STI Results GC CT  01/01/2023 10:25 AM Positive    Negative  Negative    Negative   11/04/2022 10:39 AM Negative  Negative   07/04/2022 11:34 AM Negative  Negative   10/03/2021  9:54 AM Negative  Negative   12/30/2018 12:00 AM Negative  Negative   06/30/2017 12:00 AM Negative  Negative   03/17/2017 12:00 AM Negative  Negative     Hepatitis B Lab Results  Component Value Date   HEPBSAB REACTIVE (A) 07/09/2023   HEPBSAG Negative 12/30/2018   Hepatitis C Lab Results  Component Value Date   HEPCAB NON-REACTIVE 07/09/2023    Hepatitis A Lab Results  Component Value Date   HAV NON-REACTIVE 07/09/2023   Lipids: Lab Results  Component Value Date   CHOL 162 11/04/2022   TRIG 44 11/04/2022   HDL 53 11/04/2022   CHOLHDL 3.1 11/04/2022   LDLCALC 96 11/04/2022    TARGET DATE: The 17th  Assessment: Beth George presents today for her maintenance Cabenuva injections. Past injections were tolerated well without issues. She has not seen Dr. Daiva Eves in almost a year. She is adamant about coming to her injection appointments with her mother. They have different target dates so this becomes quite difficult and has caused one or both of them to be outside their window or complicate schedules. She is scheduled with Dr. Daiva Eves in December, and I encouraged her to make sure she comes to that appointment as she needs to be seen at least yearly and do a full screen/check up with her provider. Last HIV RNA was 05/14/23 and was undetectable. Will do a full lab check today in anticipation of her appointment with Dr. Daiva Eves.  She declines all vaccines today.   Administered cabotegravir 600mg /50mL in left upper outer quadrant of the gluteal muscle. Administered rilpivirine 900 mg/38mL in the right upper outer quadrant of the gluteal muscle. No issues with injections. She will follow up in 2 months for next set of injections.  Plan: - Cabenuva injections administered - HIV RNA, CD4 count, CMP, CBC w diff, RPR, urine cytology, and lipid  panel today - Next injections scheduled for 10/27/23 with Dr. Daiva Eves and 01/04/23 with [name] - Call with any issues or questions  Jersee Winiarski L. Yoskar Murrillo, PharmD, BCIDP, AAHIVP, CPP Clinical Pharmacist Practitioner Infectious Diseases Clinical Pharmacist [facility]

## 2023-09-08 LAB — T-HELPER CELLS (CD4) COUNT (NOT AT ARMC)
CD4 % Helper T Cell: 44 % (ref 33–65)
CD4 T Cell Abs: 712 /uL (ref 400–1790)

## 2023-09-08 LAB — URINE CYTOLOGY ANCILLARY ONLY
Chlamydia: NEGATIVE
Comment: NEGATIVE
Comment: NORMAL
Neisseria Gonorrhea: NEGATIVE

## 2023-09-10 LAB — CBC WITH DIFFERENTIAL/PLATELET
Absolute Lymphocytes: 1781 {cells}/uL (ref 850–3900)
Absolute Monocytes: 475 {cells}/uL (ref 200–950)
Basophils Absolute: 38 {cells}/uL (ref 0–200)
Basophils Relative: 0.8 %
Eosinophils Absolute: 72 {cells}/uL (ref 15–500)
Eosinophils Relative: 1.5 %
HCT: 37.2 % (ref 35.0–45.0)
Hemoglobin: 11.7 g/dL (ref 11.7–15.5)
MCH: 26.6 pg — ABNORMAL LOW (ref 27.0–33.0)
MCHC: 31.5 g/dL — ABNORMAL LOW (ref 32.0–36.0)
MCV: 84.5 fL (ref 80.0–100.0)
MPV: 10.8 fL (ref 7.5–12.5)
Monocytes Relative: 9.9 %
Neutro Abs: 2434 {cells}/uL (ref 1500–7800)
Neutrophils Relative %: 50.7 %
Platelets: 273 10*3/uL (ref 140–400)
RBC: 4.4 10*6/uL (ref 3.80–5.10)
RDW: 15.4 % — ABNORMAL HIGH (ref 11.0–15.0)
Total Lymphocyte: 37.1 %
WBC: 4.8 10*3/uL (ref 3.8–10.8)

## 2023-09-10 LAB — COMPREHENSIVE METABOLIC PANEL
AG Ratio: 1.1 (calc) (ref 1.0–2.5)
ALT: 8 U/L (ref 6–29)
AST: 14 U/L (ref 10–30)
Albumin: 4.1 g/dL (ref 3.6–5.1)
Alkaline phosphatase (APISO): 50 U/L (ref 31–125)
BUN: 8 mg/dL (ref 7–25)
CO2: 25 mmol/L (ref 20–32)
Calcium: 9.2 mg/dL (ref 8.6–10.2)
Chloride: 104 mmol/L (ref 98–110)
Creat: 0.7 mg/dL (ref 0.50–0.96)
Globulin: 3.8 g/dL — ABNORMAL HIGH (ref 1.9–3.7)
Glucose, Bld: 99 mg/dL (ref 65–99)
Potassium: 3.8 mmol/L (ref 3.5–5.3)
Sodium: 137 mmol/L (ref 135–146)
Total Bilirubin: 0.3 mg/dL (ref 0.2–1.2)
Total Protein: 7.9 g/dL (ref 6.1–8.1)

## 2023-09-10 LAB — LIPID PANEL
Cholesterol: 177 mg/dL (ref <200)
HDL: 53 mg/dL (ref >=50)
LDL Cholesterol (Calc): 110 mg/dL — ABNORMAL HIGH
Non-HDL Cholesterol (Calc): 124 mg/dL (ref <130)
Total CHOL/HDL Ratio: 3.3 (calc) (ref <5.0)
Triglycerides: 59 mg/dL (ref <150)

## 2023-09-10 LAB — HIV-1 RNA QUANT-NO REFLEX-BLD
HIV 1 RNA Quant: NOT DETECTED {copies}/mL
HIV-1 RNA Quant, Log: NOT DETECTED {Log_copies}/mL

## 2023-09-10 LAB — RPR: RPR Ser Ql: NONREACTIVE

## 2023-09-23 ENCOUNTER — Other Ambulatory Visit: Payer: Self-pay

## 2023-10-08 ENCOUNTER — Other Ambulatory Visit (HOSPITAL_COMMUNITY): Payer: Self-pay

## 2023-10-08 ENCOUNTER — Other Ambulatory Visit: Payer: Self-pay

## 2023-10-08 NOTE — Progress Notes (Signed)
Specialty Pharmacy Refill Coordination Note  Beth George is a 27 y.o. female assessed today regarding refills of clinic administered specialty medication(s) Cabotegravir & Rilpivirine   Clinic requested Courier to Provider Office   Delivery date: 10/20/23   Verified address: [facility] [address] [place] [address]   Medication will be filled on 10/19/23.

## 2023-10-19 ENCOUNTER — Other Ambulatory Visit: Payer: Self-pay

## 2023-10-20 ENCOUNTER — Telehealth: Payer: Self-pay

## 2023-10-20 NOTE — Telephone Encounter (Signed)
 [facility] Patient Advocate Encounter  Patient's medications CABENUVA have been couriered to [facility] from [facility] pharmacy and will be administered at the patients appointment on 10/27/23.  Kae Heller, CPhT Specialty Pharmacy Patient [facility] for Infectious Disease Phone: [phone] Fax:  [phone]

## 2023-10-26 NOTE — Progress Notes (Unsigned)
Subjective:  Chief complaint follow-up for HIV disease on every 24-month Cabenuva  Patient ID: Beth George, female    DOB: [birth year], 27 y.o.   MRN: [id number]  HPI  Discussed the use of AI scribe software for clinical note transcription with the patient, who gave verbal consent to proceed.  History of Present Illness   The patient, with a history of HIV, presents for a scheduled Cabenuva injection. They report that they have been on Cabenuva for several years, since it first came out. They have no complaints related to the injections, stating that they do not cause pain and that any knots that form at the injection site usually resolve by their next visit. They express a preference for the injections over oral therapy.  The patient's HIV is well-controlled, with a VL <20 and healthy CD4 count of 712. Their liver and kidney function are normal, and they have no anemia. They have tested negative for gonorrhea and chlamydia. They have antibodies to hepatitis B but not to hepatitis A. They express willingness to receive a hepatitis A vaccine.  The patient has not received a flu shot or a COVID-19 vaccine. They report having had COVID-19 three times. They are considering whether to receive the flu shot.       Past Medical History:  Diagnosis Date   HIV disease affecting pregnancy 03/05/2017   CD4 586 On medication per med list Followed at [facility] to [facility] ID.    Routine screening for STI (sexually transmitted infection) 11/04/2022   Vaccine counseling 11/04/2022    No past surgical history on file.  Family History  Problem Relation Age of Onset   Cancer Father    ADD / ADHD Mother    Asthma Mother    HIV Mother    Anxiety disorder Mother       Social History   Socioeconomic History   Marital status: Single    Spouse name: Not on file   Number of children: Not on file   Years of education: Not on file   Highest education level: Not on file   Occupational History   Not on file  Tobacco Use   Smoking status: Never   Smokeless tobacco: Never  Vaping Use   Vaping status: Never Used  Substance and Sexual Activity   Alcohol use: Yes    Comment: weekends   Drug use: No   Sexual activity: Yes    Birth control/protection: None, Condom    Comment: declined condoms  Other Topics Concern   Not on file  Social History Narrative   Not on file   Social Determinants of Health   Financial Resource Strain: Not on file  Food Insecurity: Not on file  Transportation Needs: Not on file  Physical Activity: Not on file  Stress: Not on file  Social Connections: Not on file    No Known Allergies   Current Outpatient Medications:    cabotegravir & rilpivirine ER (CABENUVA) 600 & 900 MG/3ML injection, Inject 1 kit into the muscle every 2 (two) months., Disp: 6 mL, Rfl: 5    Review of Systems  Constitutional:  Negative for activity change, appetite change, chills, diaphoresis, fatigue, fever and unexpected weight change.  HENT:  Negative for congestion, rhinorrhea, sinus pressure, sneezing, sore throat and trouble swallowing.   Eyes:  Negative for photophobia and visual disturbance.  Respiratory:  Negative for cough, chest tightness, shortness of breath, wheezing and stridor.   Cardiovascular:  Negative for chest pain,  palpitations and leg swelling.  Gastrointestinal:  Negative for abdominal distention, abdominal pain, anal bleeding, blood in stool, constipation, diarrhea, nausea and vomiting.  Genitourinary:  Negative for difficulty urinating, dysuria, flank pain and hematuria.  Musculoskeletal:  Negative for arthralgias, back pain, gait problem, joint swelling and myalgias.  Skin:  Negative for color change, pallor, rash and wound.  Neurological:  Negative for dizziness, tremors, weakness and light-headedness.  Hematological:  Negative for adenopathy. Does not bruise/bleed easily.  Psychiatric/Behavioral:  Negative for agitation,  behavioral problems, confusion, decreased concentration, dysphoric mood and sleep disturbance.        Objective:   Physical Exam Constitutional:      General: She is not in acute distress.    Appearance: Normal appearance. She is well-developed. She is not ill-appearing or diaphoretic.  HENT:     Head: Normocephalic and atraumatic.     Right Ear: Hearing and external ear normal.     Left Ear: Hearing and external ear normal.     Nose: No nasal deformity or rhinorrhea.  Eyes:     General: No scleral icterus.    Conjunctiva/sclera: Conjunctivae normal.     Right eye: Right conjunctiva is not injected.     Left eye: Left conjunctiva is not injected.     Pupils: Pupils are equal, round, and reactive to light.  Neck:     Vascular: No JVD.  Cardiovascular:     Rate and Rhythm: Normal rate and regular rhythm.     Heart sounds: S1 normal and S2 normal.  Pulmonary:     Effort: Pulmonary effort is normal. No respiratory distress.     Breath sounds: No wheezing.  Abdominal:     General: There is no distension.     Palpations: Abdomen is soft.  Musculoskeletal:        General: Normal range of motion.     Right shoulder: Normal.     Left shoulder: Normal.     Cervical back: Normal range of motion and neck supple.     Right hip: Normal.     Left hip: Normal.     Right knee: Normal.     Left knee: Normal.  Lymphadenopathy:     Head:     Right side of head: No submandibular, preauricular or posterior auricular adenopathy.     Left side of head: No submandibular, preauricular or posterior auricular adenopathy.     Cervical: No cervical adenopathy.     Right cervical: No superficial or deep cervical adenopathy.    Left cervical: No superficial or deep cervical adenopathy.  Skin:    General: Skin is warm and dry.     Coloration: Skin is not pale.     Findings: No abrasion, bruising, ecchymosis, erythema, lesion or rash.     Nails: There is no clubbing.  Neurological:     General: No  focal deficit present.     Mental Status: She is alert and oriented to person, place, and time.     Sensory: No sensory deficit.     Coordination: Coordination normal.     Gait: Gait normal.  Psychiatric:        Attention and Perception: She is attentive.        Mood and Affect: Mood normal.        Speech: Speech normal.        Behavior: Behavior normal. Behavior is cooperative.        Thought Content: Thought content normal.  Judgment: Judgment normal.           Assessment & Plan:   Assessment and Plan    HIV Well controlled on Cabenuva injections. CD4 count healthy at 712. No evidence of liver, kidney dysfunction or anemia. -Continue Cabenuva injections Q2M -Order viral load.  Hepatitis A No antibodies detected. Discussed risk of transmission and benefits of vaccination. -Administer Hepatitis A vaccine today and schedule second dose in 6 months.  Influenza and COVID-19 Patient declined vaccination. -Discuss benefits of vaccination at future visits.  General Health Maintenance -Schedule next Cabenuva injection and follow-up appointment.      STI screening: negative for GC chlamydia and syphilis at last test

## 2023-10-27 ENCOUNTER — Encounter: Payer: Self-pay | Admitting: Infectious Disease

## 2023-10-27 ENCOUNTER — Other Ambulatory Visit: Payer: Self-pay

## 2023-10-27 ENCOUNTER — Ambulatory Visit (INDEPENDENT_AMBULATORY_CARE_PROVIDER_SITE_OTHER): Payer: Medicaid Other | Admitting: Infectious Disease

## 2023-10-27 VITALS — BP 141/93 | HR 82 | Temp 97.9°F | Ht 62.5 in | Wt 209.0 lb

## 2023-10-27 DIAGNOSIS — B159 Hepatitis A without hepatic coma: Secondary | ICD-10-CM

## 2023-10-27 DIAGNOSIS — Z113 Encounter for screening for infections with a predominantly sexual mode of transmission: Secondary | ICD-10-CM

## 2023-10-27 DIAGNOSIS — B20 Human immunodeficiency virus [HIV] disease: Secondary | ICD-10-CM

## 2023-10-27 DIAGNOSIS — Z7185 Encounter for immunization safety counseling: Secondary | ICD-10-CM

## 2023-10-27 MED ORDER — CABOTEGRAVIR & RILPIVIRINE ER 600 & 900 MG/3ML IM SUER
1.0000 | Freq: Once | INTRAMUSCULAR | Status: AC
  Administered 2023-10-27: 1 via INTRAMUSCULAR

## 2023-10-27 NOTE — Addendum Note (Signed)
Addended by: Linna Hoff D on: 10/27/2023 11:39 AM   Modules accepted: Orders

## 2023-10-30 LAB — HIV-1 RNA QUANT-NO REFLEX-BLD
HIV 1 RNA Quant: NOT DETECTED {copies}/mL
HIV-1 RNA Quant, Log: NOT DETECTED {Log}

## 2023-12-07 ENCOUNTER — Other Ambulatory Visit (HOSPITAL_COMMUNITY): Payer: Self-pay

## 2023-12-08 ENCOUNTER — Other Ambulatory Visit: Payer: Self-pay

## 2023-12-08 ENCOUNTER — Other Ambulatory Visit (HOSPITAL_COMMUNITY): Payer: Self-pay

## 2023-12-08 ENCOUNTER — Other Ambulatory Visit: Payer: Self-pay | Admitting: Pharmacist

## 2023-12-08 DIAGNOSIS — B20 Human immunodeficiency virus [HIV] disease: Secondary | ICD-10-CM

## 2023-12-08 MED ORDER — CABOTEGRAVIR & RILPIVIRINE ER 600 & 900 MG/3ML IM SUER
1.0000 | INTRAMUSCULAR | 5 refills | Status: DC
  Filled 2023-12-08: qty 6, 34d supply, fill #0
  Filled 2024-02-17: qty 6, 34d supply, fill #1
  Filled 2024-04-12: qty 6, 34d supply, fill #2
  Filled 2024-06-27: qty 6, 34d supply, fill #3
  Filled 2024-08-19: qty 6, 34d supply, fill #4
  Filled 2024-10-27: qty 6, 34d supply, fill #5

## 2023-12-08 NOTE — Progress Notes (Signed)
Specialty Pharmacy Refill Coordination Note  Beth George is a 28 y.o. female assessed today regarding refills of clinic administered specialty medication(s) Cabotegravir & Rilpivirine [facility])   Clinic requested Courier to Provider Office   Delivery date: 12/22/23   Verified address: [address] [place] [address]   Medication will be filled on 12/21/23.

## 2023-12-21 ENCOUNTER — Other Ambulatory Visit: Payer: Self-pay

## 2023-12-22 ENCOUNTER — Telehealth: Payer: Self-pay

## 2023-12-22 NOTE — Telephone Encounter (Signed)
 [facility] Patient Advocate Encounter  Patient's medications CABENUVA have been couriered to [facility] from [facility] and will be administered at the patients appointment on 01/05/24.  Kae Heller, CPhT Specialty Pharmacy Patient [facility] for Infectious Disease Phone: [phone] Fax:  [phone]

## 2024-01-05 ENCOUNTER — Ambulatory Visit: Payer: Medicaid Other | Admitting: Pharmacist

## 2024-01-08 ENCOUNTER — Other Ambulatory Visit: Payer: Self-pay

## 2024-01-08 ENCOUNTER — Ambulatory Visit (INDEPENDENT_AMBULATORY_CARE_PROVIDER_SITE_OTHER): Payer: Medicaid Other | Admitting: Pharmacist

## 2024-01-08 DIAGNOSIS — B20 Human immunodeficiency virus [HIV] disease: Secondary | ICD-10-CM

## 2024-01-08 MED ORDER — CABOTEGRAVIR & RILPIVIRINE ER 600 & 900 MG/3ML IM SUER
1.0000 | Freq: Once | INTRAMUSCULAR | Status: AC
  Administered 2024-01-08: 1 via INTRAMUSCULAR

## 2024-01-08 NOTE — Progress Notes (Signed)
   HPI: Beth George is a 28 y.o. female who presents to the [facility] pharmacy clinic for [place] administration.  Patient Active Problem List   Diagnosis Date Noted   Routine screening for STI (sexually transmitted infection) 11/04/2022   Vaccine counseling 11/04/2022   Leg edema 06/20/2021   Obesity (BMI 30-39.9) 06/20/2021   Trichomonal vaginitis in pregnancy 04/01/2019   Group B streptococcus urinary tract infection affecting pregnancy 01/05/2019   Supervision of high risk pregnancy, antepartum 12/30/2018   Anemia in pregnancy 05/19/2017   HIV disease (HCC) 03/05/2017    Patient's Medications  New Prescriptions   No medications on file  Previous Medications   CABOTEGRAVIR & RILPIVIRINE ER (CABENUVA) 600 & 900 MG/3ML INJECTION    Inject 1 kit into the muscle every 2 (two) months.  Modified Medications   No medications on file  Discontinued Medications   No medications on file    Allergies: No Known Allergies  Labs: Lab Results  Component Value Date   HIV1RNAQUANT Not Detected 10/27/2023   HIV1RNAQUANT Not Detected 09/07/2023   HIV1RNAQUANT Not Detected 05/14/2023   CD4TABS 712 09/07/2023   CD4TABS 844 11/04/2022   CD4TABS 749 06/20/2021    RPR and STI Lab Results  Component Value Date   LABRPR NON-REACTIVE 09/07/2023   LABRPR NON-REACTIVE 11/04/2022   LABRPR NON-REACTIVE 07/04/2022   LABRPR NON-REACTIVE 01/02/2022   LABRPR NON-REACTIVE 06/20/2021    STI Results GC CT  09/07/2023  1:26 PM Negative  Negative   01/01/2023 10:25 AM Positive    Negative  Negative    Negative   11/04/2022 10:39 AM Negative  Negative   07/04/2022 11:34 AM Negative  Negative   10/03/2021  9:54 AM Negative  Negative   12/30/2018 12:00 AM Negative  Negative   06/30/2017 12:00 AM Negative  Negative   03/17/2017 12:00 AM Negative  Negative     Hepatitis B Lab Results  Component Value Date   HEPBSAB REACTIVE (A) 07/09/2023   HEPBSAG Negative 12/30/2018   Hepatitis C Lab  Results  Component Value Date   HEPCAB NON-REACTIVE 07/09/2023   Hepatitis A Lab Results  Component Value Date   HAV NON-REACTIVE 07/09/2023   Lipids: Lab Results  Component Value Date   CHOL 177 09/07/2023   TRIG 59 09/07/2023   HDL 53 09/07/2023   CHOLHDL 3.3 09/07/2023   LDLCALC 110 (H) 09/07/2023    TARGET DATE: March 03, 2024  Assessment: Beth George presents today for her maintenance Cabenuva injections. Past injections were tolerated well without issues. Last HIV RNA was not detected on 10/27/23. We will check HIV RNA during her next visit. Overall, the patient is doing well today. No concerns since her last visit with Dr. Daiva Eves. No new partners. She declined STI testing today. We discussed eligible vaccines including PCV20, Menveo booster, flu, and COVID. She declined all vaccines today.  Administered cabotegravir 600mg /68mL in left upper outer quadrant of the gluteal muscle. Administered rilpivirine 900 mg/24mL in the right upper outer quadrant of the gluteal muscle. No issues with injections. She will follow up in 2 months for next set of injections.  Plan: - Cabenuva injections administered - Next injections scheduled for 02/25/24 with [name] - Call with any issues or questions  Buddy Duty, PharmD Student [facility] for Infectious Disease

## 2024-02-17 ENCOUNTER — Other Ambulatory Visit: Payer: Self-pay

## 2024-02-17 ENCOUNTER — Other Ambulatory Visit (HOSPITAL_COMMUNITY): Payer: Self-pay

## 2024-02-17 NOTE — Progress Notes (Signed)
 Specialty Pharmacy Refill Coordination Note  Beth George is a 28 y.o. female assessed today regarding refills of clinic administered specialty medication(s) Cabotegravir & Rilpivirine [facility])   Clinic requested Courier to Provider Office   Delivery date: 02/22/24   Verified address: [address] [place] [address]   Medication will be filled on 02/19/24.

## 2024-02-19 ENCOUNTER — Other Ambulatory Visit: Payer: Self-pay

## 2024-02-22 ENCOUNTER — Telehealth: Payer: Self-pay

## 2024-02-22 NOTE — Telephone Encounter (Signed)
 [facility] Patient Advocate Encounter  Patient's medications Beth George have been couriered to [facility] from [facility] pharmacy and will be administered at the patients appointment on 02/25/24.  [name], CPhT Specialty Pharmacy Patient [facility] for Infectious Disease Phone: [phone] Fax:  [phone]

## 2024-02-24 NOTE — Progress Notes (Unsigned)
   HPI: Beth George is a 28 y.o. female who presents to the [facility] pharmacy clinic for Lebo administration.  Patient Active Problem List   Diagnosis Date Noted   Routine screening for STI (sexually transmitted infection) 11/04/2022   Vaccine counseling 11/04/2022   Leg edema 06/20/2021   Obesity (BMI 30-39.9) 06/20/2021   Trichomonal vaginitis in pregnancy 04/01/2019   Group B streptococcus urinary tract infection affecting pregnancy 01/05/2019   Supervision of high risk pregnancy, antepartum 12/30/2018   Anemia in pregnancy 05/19/2017   HIV disease (HCC) 03/05/2017    Patient's Medications  New Prescriptions   No medications on file  Previous Medications   CABOTEGRAVIR & RILPIVIRINE ER (CABENUVA) 600 & 900 MG/3ML INJECTION    Inject 1 kit into the muscle every 2 (two) months.  Modified Medications   No medications on file  Discontinued Medications   No medications on file    Allergies: No Known Allergies  Labs: Lab Results  Component Value Date   HIV1RNAQUANT Not Detected 10/27/2023   HIV1RNAQUANT Not Detected 09/07/2023   HIV1RNAQUANT Not Detected 05/14/2023   CD4TABS 712 09/07/2023   CD4TABS 844 11/04/2022   CD4TABS 749 06/20/2021    RPR and STI Lab Results  Component Value Date   LABRPR NON-REACTIVE 09/07/2023   LABRPR NON-REACTIVE 11/04/2022   LABRPR NON-REACTIVE 07/04/2022   LABRPR NON-REACTIVE 01/02/2022   LABRPR NON-REACTIVE 06/20/2021    STI Results GC CT  09/07/2023  1:26 PM Negative  Negative   01/01/2023 10:25 AM Positive    Negative  Negative    Negative   11/04/2022 10:39 AM Negative  Negative   07/04/2022 11:34 AM Negative  Negative   10/03/2021  9:54 AM Negative  Negative   12/30/2018 12:00 AM Negative  Negative   06/30/2017 12:00 AM Negative  Negative   03/17/2017 12:00 AM Negative  Negative     Hepatitis B Lab Results  Component Value Date   HEPBSAB REACTIVE (A) 07/09/2023   HEPBSAG Negative 12/30/2018   Hepatitis C Lab  Results  Component Value Date   HEPCAB NON-REACTIVE 07/09/2023   Hepatitis A Lab Results  Component Value Date   HAV NON-REACTIVE 07/09/2023   Lipids: Lab Results  Component Value Date   CHOL 177 09/07/2023   TRIG 59 09/07/2023   HDL 53 09/07/2023   CHOLHDL 3.3 09/07/2023   LDLCALC 110 (H) 09/07/2023    TARGET DATE: 17th  Assessment: Beth George presents with her mother and daughter today for her maintenance Cabenuva injections. Past injections were tolerated well without issues. Last HIV RNA was undetectable in December 2024. Doing well with no issues today. Declines STI testing today.   2nd dose Hepatitis A due at next visit in June. We discussed eligible vaccines including PCV20, Menveo booster, flu, and COVID. She defers all vaccines today to the next visit, when her Hepatitis A 2nd dose is due.  Administered cabotegravir 600mg /57mL in left upper outer quadrant of the gluteal muscle. Administered rilpivirine 900 mg/60mL in the right upper outer quadrant of the gluteal muscle. No issues with injections. Beth George will follow up in 2 months with Dr Daiva Eves for next set of injections.  Plan: - Cabenuva injections administered - Next injections scheduled for May 04 2024 Dr. Daiva Eves - Call with any issues or questions  Haze Boyden PharmD Candidate

## 2024-02-25 ENCOUNTER — Other Ambulatory Visit: Payer: Self-pay

## 2024-02-25 ENCOUNTER — Ambulatory Visit (INDEPENDENT_AMBULATORY_CARE_PROVIDER_SITE_OTHER): Payer: Medicaid Other | Admitting: Pharmacist

## 2024-02-25 DIAGNOSIS — B20 Human immunodeficiency virus [HIV] disease: Secondary | ICD-10-CM | POA: Diagnosis present

## 2024-02-25 DIAGNOSIS — Z113 Encounter for screening for infections with a predominantly sexual mode of transmission: Secondary | ICD-10-CM

## 2024-02-25 MED ORDER — CABOTEGRAVIR & RILPIVIRINE ER 600 & 900 MG/3ML IM SUER
1.0000 | Freq: Once | INTRAMUSCULAR | Status: AC
  Administered 2024-02-25: 1 via INTRAMUSCULAR

## 2024-04-12 ENCOUNTER — Other Ambulatory Visit: Payer: Self-pay

## 2024-04-12 ENCOUNTER — Other Ambulatory Visit (HOSPITAL_COMMUNITY): Payer: Self-pay

## 2024-04-12 NOTE — Progress Notes (Signed)
 Specialty Pharmacy Refill Coordination Note  Beth George is a 28 y.o. female assessed today regarding refills of clinic administered specialty medication(s) Cabotegravir  & Rilpivirine  (CABENUVA )   Clinic requested Courier to Provider Office   Delivery date: 05/02/24  Verified address: [address] [place] [address]   Medication will be filled on 04/29/24.

## 2024-04-14 ENCOUNTER — Other Ambulatory Visit (HOSPITAL_COMMUNITY): Payer: Self-pay

## 2024-04-29 ENCOUNTER — Other Ambulatory Visit: Payer: Self-pay

## 2024-04-29 ENCOUNTER — Other Ambulatory Visit (HOSPITAL_COMMUNITY): Payer: Self-pay

## 2024-05-02 ENCOUNTER — Telehealth: Payer: Self-pay

## 2024-05-02 NOTE — Telephone Encounter (Signed)
 [facility] Patient Advocate Encounter  Patient's medications Cabenuva  have been couriered to [facility] from [facility] and will be administered at the patients appointment on 05/04/24.  Roylene Corn, CPhT Specialty Pharmacy Patient [facility] for Infectious Disease Phone: [phone] Fax:  [phone]

## 2024-05-03 NOTE — Progress Notes (Unsigned)
   HPI: Beth George is a 28 y.o. female who presents to the [facility] pharmacy clinic for Cabenuva  administration.  Patient Active Problem List   Diagnosis Date Noted   Routine screening for STI (sexually transmitted infection) 11/04/2022   Vaccine counseling 11/04/2022   Leg edema 06/20/2021   Obesity (BMI 30-39.9) 06/20/2021   Trichomonal vaginitis in pregnancy 04/01/2019   Group B streptococcus urinary tract infection affecting pregnancy 01/05/2019   Supervision of high risk pregnancy, antepartum 12/30/2018   Anemia in pregnancy 05/19/2017   HIV disease (HCC) 03/05/2017    Patient's Medications  New Prescriptions   No medications on file  Previous Medications   CABOTEGRAVIR  & RILPIVIRINE  ER (CABENUVA ) 600 & 900 MG/3ML INJECTION    Inject 1 kit into the muscle every 2 (two) months.  Modified Medications   No medications on file  Discontinued Medications   No medications on file    Allergies: No Known Allergies  Labs: Lab Results  Component Value Date   HIV1RNAQUANT Not Detected 10/27/2023   HIV1RNAQUANT Not Detected 09/07/2023   HIV1RNAQUANT Not Detected 05/14/2023   CD4TABS 712 09/07/2023   CD4TABS 844 11/04/2022   CD4TABS 749 06/20/2021    RPR and STI Lab Results  Component Value Date   LABRPR NON-REACTIVE 09/07/2023   LABRPR NON-REACTIVE 11/04/2022   LABRPR NON-REACTIVE 07/04/2022   LABRPR NON-REACTIVE 01/02/2022   LABRPR NON-REACTIVE 06/20/2021    STI Results GC CT  09/07/2023  1:26 PM Negative  Negative   01/01/2023 10:25 AM Positive    Negative  Negative    Negative   11/04/2022 10:39 AM Negative  Negative   07/04/2022 11:34 AM Negative  Negative   10/03/2021  9:54 AM Negative  Negative   12/30/2018 12:00 AM Negative  Negative   06/30/2017 12:00 AM Negative  Negative   03/17/2017 12:00 AM Negative  Negative     Hepatitis B Lab Results  Component Value Date   HEPBSAB REACTIVE (A) 07/09/2023   HEPBSAG Negative 12/30/2018   Hepatitis C Lab  Results  Component Value Date   HEPCAB NON-REACTIVE 07/09/2023   Hepatitis A Lab Results  Component Value Date   HAV NON-REACTIVE 07/09/2023   Lipids: Lab Results  Component Value Date   CHOL 177 09/07/2023   TRIG 59 09/07/2023   HDL 53 09/07/2023   CHOLHDL 3.3 09/07/2023   LDLCALC 110 (H) 09/07/2023    TARGET DATE: 17th  Assessment: Beth George presents today for her maintenance Cabenuva  injections. Past injections were tolerated well without issues. Last HIV RNA was undetectable in December 2024. Doing well with no issues today.  Administered cabotegravir  600mg /67mL in left upper outer quadrant of the gluteal muscle. Administered rilpivirine  900 mg/3mL in the right upper outer quadrant of the gluteal muscle. No issues with injections. She will follow up in 2 months for next set of injections.  She is eligible for HAV 2/2 and Menveo booster today. She agrees to administer both vaccines today.   Plan: - Cabenuva  injections administered -HAV 2/2 and Menveo adminstered  - Next injections scheduled for 08/18 with Dr. Ernie Heal - Call with any issues or questions  Beth George, PharmD [facility] PGY-1

## 2024-05-04 ENCOUNTER — Encounter: Admitting: Infectious Disease

## 2024-05-04 ENCOUNTER — Ambulatory Visit: Admitting: Pharmacist

## 2024-05-04 ENCOUNTER — Other Ambulatory Visit: Payer: Self-pay

## 2024-05-04 DIAGNOSIS — B20 Human immunodeficiency virus [HIV] disease: Secondary | ICD-10-CM | POA: Diagnosis present

## 2024-05-04 DIAGNOSIS — Z23 Encounter for immunization: Secondary | ICD-10-CM

## 2024-05-04 MED ORDER — CABOTEGRAVIR & RILPIVIRINE ER 600 & 900 MG/3ML IM SUER
1.0000 | Freq: Once | INTRAMUSCULAR | Status: AC
  Administered 2024-05-04: 1 via INTRAMUSCULAR

## 2024-06-27 ENCOUNTER — Other Ambulatory Visit: Payer: Self-pay

## 2024-06-27 ENCOUNTER — Other Ambulatory Visit (HOSPITAL_COMMUNITY): Payer: Self-pay

## 2024-06-27 NOTE — Progress Notes (Signed)
 Specialty Pharmacy Refill Coordination Note  Beth George is a 28 y.o. female assessed today regarding refills of clinic administered specialty medication(s) Cabotegravir & Rilpivirine [facility])   Clinic requested Courier to Provider Office   Delivery date: 06/30/24   Verified address: [address] [place] [address]   Medication will be filled on 06/29/24.

## 2024-06-29 ENCOUNTER — Other Ambulatory Visit: Payer: Self-pay

## 2024-06-30 ENCOUNTER — Telehealth: Payer: Self-pay

## 2024-06-30 ENCOUNTER — Ambulatory Visit: Payer: Self-pay | Admitting: Pharmacist

## 2024-06-30 NOTE — Telephone Encounter (Signed)
 [facility] Patient Advocate Encounter  Patient's medications Cabenuva  have been couriered to [facility] from [facility] and will be administered at the patients appointment on 07/04/24.  Arland Hutchinson, CPhT Specialty Pharmacy Patient [facility] for Infectious Disease Phone: [phone] Fax:  [phone]

## 2024-07-02 NOTE — Progress Notes (Unsigned)
 Subjective:  Chief complaint: follow-up for HIV disease on medications   Patient ID: Beth George, female    DOB: [birth year], 28 y.o.   MRN: [id number]  HPI  Discussed the use of AI scribe software for clinical note transcription with the patient, who gave verbal consent to proceed.  History of Present Illness   Beth George is a 28 year old female with HIV who presents with numbness and swelling in her hands.  She has been experiencing numbness in her hands every night upon waking for the past three weeks.  She mentions that her feet are always swollen, but the numbness and swelling in her hands are new symptoms.   She has a history of HIV and is currently on Cabenuva , which is her only medication.       Past Medical History:  Diagnosis Date   HIV disease affecting pregnancy 03/05/2017   CD4 586 On medication per med list Followed at [facility] to [facility] ID.    Routine screening for STI (sexually transmitted infection) 11/04/2022   Vaccine counseling 11/04/2022    No past surgical history on file.  Family History  Problem Relation Age of Onset   Cancer Father    ADD / ADHD Mother    Asthma Mother    HIV Mother    Anxiety disorder Mother       Social History   Socioeconomic History   Marital status: Single    Spouse name: Not on file   Number of children: Not on file   Years of education: Not on file   Highest education level: Not on file  Occupational History   Not on file  Tobacco Use   Smoking status: Never   Smokeless tobacco: Never  Vaping Use   Vaping status: Never Used  Substance and Sexual Activity   Alcohol use: Yes    Comment: weekends   Drug use: No   Sexual activity: Yes    Birth control/protection: None, Condom    Comment: declined condoms  Other Topics Concern   Not on file  Social History Narrative   Not on file   Social Drivers of Health   Financial Resource Strain: Not on file  Food Insecurity: Not on  file  Transportation Needs: Not on file  Physical Activity: Not on file  Stress: Not on file  Social Connections: Not on file    No Known Allergies   Current Outpatient Medications:    cabotegravir  & rilpivirine  ER (CABENUVA ) 600 & 900 MG/3ML injection, Inject 1 kit into the muscle every 2 (two) months., Disp: 6 mL, Rfl: 5   Review of Systems  Constitutional:  Negative for activity change, appetite change, chills, diaphoresis, fatigue, fever and unexpected weight change.  HENT:  Negative for congestion, rhinorrhea, sinus pressure, sneezing, sore throat and trouble swallowing.   Eyes:  Negative for photophobia and visual disturbance.  Respiratory:  Negative for cough, chest tightness, shortness of breath, wheezing and stridor.   Cardiovascular:  Negative for chest pain, palpitations and leg swelling.  Gastrointestinal:  Negative for abdominal distention, abdominal pain, anal bleeding, blood in stool, constipation, diarrhea, nausea and vomiting.  Genitourinary:  Negative for difficulty urinating, dysuria, flank pain and hematuria.  Musculoskeletal:  Negative for arthralgias, back pain, gait problem, joint swelling and myalgias.  Skin:  Negative for color change, pallor, rash and wound.  Neurological:  Negative for dizziness, tremors, weakness and light-headedness.  Hematological:  Negative for adenopathy. Does not bruise/bleed easily.  Psychiatric/Behavioral:  Negative for agitation, behavioral problems, confusion, decreased concentration, dysphoric mood and sleep disturbance.        Objective:   Physical Exam Constitutional:      General: She is not in acute distress.    Appearance: Normal appearance. She is well-developed. She is not ill-appearing or diaphoretic.  HENT:     Head: Normocephalic and atraumatic.     Right Ear: Hearing and external ear normal.     Left Ear: Hearing and external ear normal.     Nose: No nasal deformity or rhinorrhea.  Eyes:     General: No scleral  icterus.    Conjunctiva/sclera: Conjunctivae normal.     Right eye: Right conjunctiva is not injected.     Left eye: Left conjunctiva is not injected.     Pupils: Pupils are equal, round, and reactive to light.  Neck:     Vascular: No JVD.  Cardiovascular:     Rate and Rhythm: Normal rate and regular rhythm.     Heart sounds: S1 normal and S2 normal.  Pulmonary:     Effort: Pulmonary effort is normal. No respiratory distress.     Breath sounds: No wheezing.  Abdominal:     General: There is no distension.     Palpations: Abdomen is soft.  Musculoskeletal:        General: Normal range of motion.     Right shoulder: Normal.     Left shoulder: Normal.     Cervical back: Normal range of motion and neck supple.     Right hip: Normal.     Left hip: Normal.     Right knee: Normal.     Left knee: Normal.  Lymphadenopathy:     Head:     Right side of head: No submandibular, preauricular or posterior auricular adenopathy.     Left side of head: No submandibular, preauricular or posterior auricular adenopathy.     Cervical: No cervical adenopathy.     Right cervical: No superficial or deep cervical adenopathy.    Left cervical: No superficial or deep cervical adenopathy.  Skin:    General: Skin is warm and dry.     Coloration: Skin is not pale.     Findings: No abrasion, bruising, ecchymosis, erythema, lesion or rash.     Nails: There is no clubbing.  Neurological:     Mental Status: She is alert and oriented to person, place, and time.     Sensory: No sensory deficit.     Coordination: Coordination normal.     Gait: Gait normal.  Psychiatric:        Attention and Perception: She is attentive.        Mood and Affect: Mood normal.        Speech: Speech normal.        Behavior: Behavior normal. Behavior is cooperative.        Thought Content: Thought content normal.        Judgment: Judgment normal.           Assessment & Plan:   Assessment and Plan    Hand numbness and  swelling, : I would expect this will be a transient symptom. It would make zero sense for this to be HIV neuropathy this far in and it would not be related to Cabenuva . Would consider Diabetes, AI pathology though doubt those either --check ESR, CRP, RF, A1c   HIV disease HIV well-controlled with Cabenuva . Discussed potential future cholesterol management  with statins due to HIV-related inflammation. - Continue Cabenuv, check HIV RNA, CD4 - Plan for future cholesterol management with statins at age 66.  STi screening: check RPR and urine GC and CHL

## 2024-07-04 ENCOUNTER — Encounter: Payer: Self-pay | Admitting: Infectious Disease

## 2024-07-04 ENCOUNTER — Ambulatory Visit (INDEPENDENT_AMBULATORY_CARE_PROVIDER_SITE_OTHER): Admitting: Infectious Disease

## 2024-07-04 ENCOUNTER — Other Ambulatory Visit: Payer: Self-pay

## 2024-07-04 ENCOUNTER — Other Ambulatory Visit (HOSPITAL_COMMUNITY)
Admission: RE | Admit: 2024-07-04 | Discharge: 2024-07-04 | Disposition: A | Discharge status: Home / Self Care | Source: Ambulatory Visit | Attending: Infectious Disease | Admitting: Infectious Disease

## 2024-07-04 VITALS — BP 142/95 | HR 76 | Temp 98.2°F | Ht 62.5 in | Wt 207.0 lb

## 2024-07-04 DIAGNOSIS — M2550 Pain in unspecified joint: Secondary | ICD-10-CM | POA: Insufficient documentation

## 2024-07-04 DIAGNOSIS — M25541 Pain in joints of right hand: Secondary | ICD-10-CM | POA: Diagnosis not present

## 2024-07-04 DIAGNOSIS — Z7185 Encounter for immunization safety counseling: Secondary | ICD-10-CM

## 2024-07-04 DIAGNOSIS — M25542 Pain in joints of left hand: Secondary | ICD-10-CM | POA: Diagnosis not present

## 2024-07-04 DIAGNOSIS — B20 Human immunodeficiency virus [HIV] disease: Secondary | ICD-10-CM

## 2024-07-04 DIAGNOSIS — Z113 Encounter for screening for infections with a predominantly sexual mode of transmission: Secondary | ICD-10-CM | POA: Insufficient documentation

## 2024-07-04 DIAGNOSIS — R200 Anesthesia of skin: Secondary | ICD-10-CM | POA: Diagnosis not present

## 2024-07-04 MED ORDER — CABOTEGRAVIR & RILPIVIRINE ER 600 & 900 MG/3ML IM SUER
1.0000 | Freq: Once | INTRAMUSCULAR | Status: AC
  Administered 2024-07-04: 1 via INTRAMUSCULAR

## 2024-07-05 ENCOUNTER — Ambulatory Visit: Payer: Self-pay

## 2024-07-05 LAB — T-HELPER CELLS (CD4) COUNT (NOT AT ARMC)
CD4 % Helper T Cell: 41 % (ref 33–65)
CD4 T Cell Abs: 587 /uL (ref 400–1790)

## 2024-07-05 LAB — URINE CYTOLOGY ANCILLARY ONLY
Chlamydia: NEGATIVE
Comment: NEGATIVE
Comment: NORMAL
Neisseria Gonorrhea: NEGATIVE

## 2024-07-06 LAB — COMPLETE METABOLIC PANEL WITHOUT GFR
AG Ratio: 1.3 (calc) (ref 1.0–2.5)
ALT: 10 U/L (ref 6–29)
AST: 13 U/L (ref 10–30)
Albumin: 4.3 g/dL (ref 3.6–5.1)
Alkaline phosphatase (APISO): 53 U/L (ref 31–125)
BUN: 7 mg/dL (ref 7–25)
CO2: 25 mmol/L (ref 20–32)
Calcium: 9.1 mg/dL (ref 8.6–10.2)
Chloride: 106 mmol/L (ref 98–110)
Creat: 0.72 mg/dL (ref 0.50–0.96)
Globulin: 3.4 g/dL (ref 1.9–3.7)
Glucose, Bld: 97 mg/dL (ref 65–99)
Potassium: 3.6 mmol/L (ref 3.5–5.3)
Sodium: 138 mmol/L (ref 135–146)
Total Bilirubin: 0.5 mg/dL (ref 0.2–1.2)
Total Protein: 7.7 g/dL (ref 6.1–8.1)

## 2024-07-06 LAB — HEMOGLOBIN A1C
Hgb A1c MFr Bld: 5.9 % — ABNORMAL HIGH (ref <5.7)
Mean Plasma Glucose: 123 mg/dL
eAG (mmol/L): 6.8 mmol/L

## 2024-07-06 LAB — CBC WITH DIFFERENTIAL/PLATELET
Absolute Lymphocytes: 1564 {cells}/uL (ref 850–3900)
Absolute Monocytes: 309 {cells}/uL (ref 200–950)
Basophils Absolute: 41 {cells}/uL (ref 0–200)
Basophils Relative: 1.2 %
Eosinophils Absolute: 31 {cells}/uL (ref 15–500)
Eosinophils Relative: 0.9 %
HCT: 36.9 % (ref 35.0–45.0)
Hemoglobin: 11.2 g/dL — ABNORMAL LOW (ref 11.7–15.5)
MCH: 26 pg — ABNORMAL LOW (ref 27.0–33.0)
MCHC: 30.4 g/dL — ABNORMAL LOW (ref 32.0–36.0)
MCV: 85.8 fL (ref 80.0–100.0)
MPV: 10.6 fL (ref 7.5–12.5)
Monocytes Relative: 9.1 %
Neutro Abs: 1455 {cells}/uL — ABNORMAL LOW (ref 1500–7800)
Neutrophils Relative %: 42.8 %
Platelets: 276 Thousand/uL (ref 140–400)
RBC: 4.3 Million/uL (ref 3.80–5.10)
RDW: 15.4 % — ABNORMAL HIGH (ref 11.0–15.0)
Total Lymphocyte: 46 %
WBC: 3.4 Thousand/uL — ABNORMAL LOW (ref 3.8–10.8)

## 2024-07-06 LAB — RHEUMATOID FACTOR: Rheumatoid fact SerPl-aCnc: <10 [IU]/mL (ref <14)

## 2024-07-06 LAB — LIPID PANEL
Cholesterol: 185 mg/dL (ref <200)
HDL: 47 mg/dL — ABNORMAL LOW (ref >=50)
LDL Cholesterol (Calc): 123 mg/dL — ABNORMAL HIGH
Non-HDL Cholesterol (Calc): 138 mg/dL — ABNORMAL HIGH (ref <130)
Total CHOL/HDL Ratio: 3.9 (calc) (ref <5.0)
Triglycerides: 61 mg/dL (ref <150)

## 2024-07-06 LAB — HIV-1 RNA QUANT-NO REFLEX-BLD
HIV 1 RNA Quant: NOT DETECTED {copies}/mL
HIV-1 RNA Quant, Log: NOT DETECTED {Log_copies}/mL

## 2024-07-06 LAB — SEDIMENTATION RATE: Sed Rate: 29 mm/h — ABNORMAL HIGH (ref 0–20)

## 2024-07-06 LAB — C-REACTIVE PROTEIN: CRP: <3 mg/L (ref <8.0)

## 2024-07-06 LAB — RPR: RPR Ser Ql: NONREACTIVE

## 2024-07-25 DIAGNOSIS — R070 Pain in throat: Secondary | ICD-10-CM | POA: Diagnosis not present

## 2024-07-25 DIAGNOSIS — R0981 Nasal congestion: Secondary | ICD-10-CM | POA: Diagnosis not present

## 2024-07-25 DIAGNOSIS — R509 Fever, unspecified: Secondary | ICD-10-CM | POA: Diagnosis not present

## 2024-07-25 DIAGNOSIS — R051 Acute cough: Secondary | ICD-10-CM | POA: Diagnosis not present

## 2024-08-09 DIAGNOSIS — G5623 Lesion of ulnar nerve, bilateral upper limbs: Secondary | ICD-10-CM | POA: Diagnosis not present

## 2024-08-09 DIAGNOSIS — G5601 Carpal tunnel syndrome, right upper limb: Secondary | ICD-10-CM | POA: Diagnosis not present

## 2024-08-19 ENCOUNTER — Other Ambulatory Visit (HOSPITAL_COMMUNITY): Payer: Self-pay

## 2024-08-19 ENCOUNTER — Other Ambulatory Visit: Payer: Self-pay

## 2024-08-19 NOTE — Progress Notes (Signed)
 Specialty Pharmacy Refill Coordination Note  Beth George is a 28 y.o. female assessed today regarding refills of clinic administered specialty medication(s) Cabotegravir  & Rilpivirine  (CABENUVA )   Clinic requested Courier to Provider Office   Delivery date: 08/29/24   Verified address: [address] [place] [address]   Medication will be filled on 08/26/24.

## 2024-08-26 ENCOUNTER — Other Ambulatory Visit: Payer: Self-pay

## 2024-08-29 ENCOUNTER — Telehealth: Payer: Self-pay

## 2024-08-29 NOTE — Telephone Encounter (Signed)
 [facility] Patient Advocate Encounter  Patient's medications Cabenuva  have been couriered to [facility] from [facility] and will be administered at the patients appointment on 08/31/24.  Arland Hutchinson, CPhT Specialty Pharmacy Patient [facility] for Infectious Disease Phone: [phone] Fax:  [phone]

## 2024-08-30 NOTE — Progress Notes (Unsigned)
 HPI: Beth George is a 28 y.o. female who presents to the [facility] pharmacy clinic for Cabenuva  administration.  Referring ID Physician: Dr. Fleeta Rothman  Patient Active Problem List   Diagnosis Date Noted   Hand numbness 07/04/2024   Arthralgia 07/04/2024   Routine screening for STI (sexually transmitted infection) 11/04/2022   Vaccine counseling 11/04/2022   Leg edema 06/20/2021   Obesity (BMI 30-39.9) 06/20/2021   Trichomonal vaginitis in pregnancy 04/01/2019   Group B streptococcus urinary tract infection affecting pregnancy 01/05/2019   Supervision of high risk pregnancy, antepartum 12/30/2018   Anemia in pregnancy 05/19/2017   HIV disease (HCC) 03/05/2017    Patient's Medications  New Prescriptions   No medications on file  Previous Medications   CABOTEGRAVIR  & RILPIVIRINE  ER (CABENUVA ) 600 & 900 MG/3ML INJECTION    Inject 1 kit into the muscle every 2 (two) months.  Modified Medications   No medications on file  Discontinued Medications   No medications on file    Allergies: No Known Allergies  Past Medical History: Past Medical History:  Diagnosis Date   Arthralgia 07/04/2024   Hand numbness 07/04/2024   HIV disease affecting pregnancy 03/05/2017   CD4 586 On medication per med list Followed at [facility] to [facility] ID.    Routine screening for STI (sexually transmitted infection) 11/04/2022   Vaccine counseling 11/04/2022    Social History: Social History   Socioeconomic History   Marital status: Single    Spouse name: Not on file   Number of children: Not on file   Years of education: Not on file   Highest education level: Not on file  Occupational History   Not on file  Tobacco Use   Smoking status: Never   Smokeless tobacco: Never  Vaping Use   Vaping status: Never Used  Substance and Sexual Activity   Alcohol use: Yes    Comment: weekends   Drug use: No   Sexual activity: Yes    Birth control/protection: None, Condom     Comment: declined condoms  Other Topics Concern   Not on file  Social History Narrative   Not on file   Social Drivers of Health   Financial Resource Strain: Not on file  Food Insecurity: Not on file  Transportation Needs: Not on file  Physical Activity: Not on file  Stress: Not on file  Social Connections: Not on file    Labs: Lab Results  Component Value Date   HIV1RNAQUANT NOT DETECTED 07/04/2024   HIV1RNAQUANT Not Detected 10/27/2023   HIV1RNAQUANT Not Detected 09/07/2023   CD4TABS 587 07/04/2024   CD4TABS 712 09/07/2023   CD4TABS 844 11/04/2022    RPR and STI Lab Results  Component Value Date   LABRPR NON-REACTIVE 07/04/2024   LABRPR NON-REACTIVE 09/07/2023   LABRPR NON-REACTIVE 11/04/2022   LABRPR NON-REACTIVE 07/04/2022   LABRPR NON-REACTIVE 01/02/2022    STI Results GC CT  07/04/2024  9:37 AM Negative  Negative   09/07/2023  1:26 PM Negative  Negative   01/01/2023 10:25 AM Positive    Negative  Negative    Negative   11/04/2022 10:39 AM Negative  Negative   07/04/2022 11:34 AM Negative  Negative   10/03/2021  9:54 AM Negative  Negative   12/30/2018 12:00 AM Negative  Negative   06/30/2017 12:00 AM Negative  Negative   03/17/2017 12:00 AM Negative  Negative     Hepatitis B Lab Results  Component Value Date   HEPBSAB REACTIVE (A)  07/09/2023   HEPBSAG Negative 12/30/2018   Hepatitis C Lab Results  Component Value Date   HEPCAB NON-REACTIVE 07/09/2023   Hepatitis A Lab Results  Component Value Date   HAV NON-REACTIVE 07/09/2023   Lipids: Lab Results  Component Value Date   CHOL 185 07/04/2024   TRIG 61 07/04/2024   HDL 47 (L) 07/04/2024   CHOLHDL 3.9 07/04/2024   LDLCALC 123 (H) 07/04/2024    TARGET DATE:  The 17th of the month  Assessment: Idabell presents today for their maintenance Cabenuva  injections. Initial/past injections were tolerated well without issues. No problems with systemic effects of injections.   Administered  cabotegravir  600mg /31mL in left upper outer quadrant of the gluteal muscle. Administered rilpivirine  900 mg/3mL in the right upper outer quadrant of the gluteal muscle. Monitored patient for 10 minutes after injection. Injections were tolerated well without issue. Patient will follow up in 2 months for next injection. Will defer HIV RNA testing as recently assessed in August.  Eligible for flu, COVID, and PCV20 vaccines which she declines today. Politely declines STI testing due to no new sexual partners. She requests to start Depo injection for contraception until she establishes with a new OBGYN in January; reached out to Dr. Fleeta Rothman and triage team for more information and coordination.   Plan: - Cabenuva  injections administered - Next injections scheduled for 12/16 with me and 2/17 with Dr. Fleeta Rothman  - Follow up on possible Depo injection before [facility] appointment in January 2026 - Call with any issues or questions  Alan Geralds, PharmD, CPP, BCIDP, AAHIVP Clinical Pharmacist Practitioner Infectious Diseases Clinical Pharmacist [facility]

## 2024-08-31 ENCOUNTER — Telehealth: Payer: Self-pay

## 2024-08-31 ENCOUNTER — Other Ambulatory Visit: Payer: Self-pay

## 2024-08-31 ENCOUNTER — Ambulatory Visit: Payer: Self-pay | Admitting: Pharmacist

## 2024-08-31 DIAGNOSIS — B20 Human immunodeficiency virus [HIV] disease: Secondary | ICD-10-CM | POA: Diagnosis present

## 2024-08-31 MED ORDER — CABOTEGRAVIR & RILPIVIRINE ER 600 & 900 MG/3ML IM SUER
1.0000 | Freq: Once | INTRAMUSCULAR | Status: AC
  Administered 2024-08-31: 1 via INTRAMUSCULAR

## 2024-08-31 NOTE — Telephone Encounter (Signed)
-----   Message from [place] sent at 08/31/2024 12:04 PM EDT ----- NO problem I think that would be reasonable ----- Message ----- From: Waddell Alan PARAS, RPH-CPP Sent: 08/31/2024  10:50 AM EDT To: Jomarie LOISE Fleeta Kathie, MD; [facility] Triage Nurse P#  Patient states she is interested in depo shot for contraceptive. Scheduled with OBGYN in January (earliest appt she could schedule) but wanted to see if we could provide one injection to cover her until that visit. Passing along for how that process would work or if we can even provide that. Thank you!

## 2024-08-31 NOTE — Telephone Encounter (Signed)
 Per Dr. Fleeta Rothman okay to continue with Depo. Will need to schedule nurse visit and lab appt for urine pregnancy test.  Will send mychart message asking pt to schedule appt. Lorenda CHRISTELLA Code, RMA

## 2024-09-26 NOTE — Telephone Encounter (Signed)
 Left vm to ask patient if she would like to proceed with appt for depo/ urine pregnancy test. Lorenda CHRISTELLA Code, RMA

## 2024-10-18 ENCOUNTER — Other Ambulatory Visit (HOSPITAL_COMMUNITY): Payer: Self-pay

## 2024-10-18 ENCOUNTER — Other Ambulatory Visit: Payer: Self-pay

## 2024-10-18 NOTE — Progress Notes (Signed)
 Specialty Pharmacy Refill Coordination Note  Beth George is a 28 y.o. female assessed today regarding refills of clinic administered specialty medication(s) Cabotegravir  & Rilpivirine  (CABENUVA )   Clinic requested Courier to Provider Office   Delivery date: 10/24/24   Verified address: [address] [place] [address]   Medication will be filled on 10/21/24.

## 2024-10-27 ENCOUNTER — Other Ambulatory Visit (HOSPITAL_COMMUNITY): Payer: Self-pay

## 2024-10-28 ENCOUNTER — Other Ambulatory Visit: Payer: Self-pay

## 2024-10-28 NOTE — Progress Notes (Signed)
 HPI: Beth George is a 28 y.o. female who presents to the [facility] pharmacy clinic for Cabenuva  administration.  Referring ID Physician: Dr. Fleeta Rothman  Patient Active Problem List   Diagnosis Date Noted   Hand numbness 07/04/2024   Arthralgia 07/04/2024   Routine screening for STI (sexually transmitted infection) 11/04/2022   Vaccine counseling 11/04/2022   Leg edema 06/20/2021   Obesity (BMI 30-39.9) 06/20/2021   Trichomonal vaginitis in pregnancy 04/01/2019   Group B streptococcus urinary tract infection affecting pregnancy 01/05/2019   Supervision of high risk pregnancy, antepartum 12/30/2018   Anemia in pregnancy 05/19/2017   HIV disease (HCC) 03/05/2017    Patient's Medications  New Prescriptions   No medications on file  Previous Medications   CABOTEGRAVIR  & RILPIVIRINE  ER (CABENUVA ) 600 & 900 MG/3ML INJECTION    Inject 1 kit into the muscle every 2 (two) months.  Modified Medications   No medications on file  Discontinued Medications   No medications on file    Allergies: Allergies[1]  Past Medical History: Past Medical History:  Diagnosis Date   Arthralgia 07/04/2024   Hand numbness 07/04/2024   HIV disease affecting pregnancy 03/05/2017   CD4 586 On medication per med list Follow[facility] at [facility] to [facility] ID.    Routine screening for STI (sexually transmitted infection) 11/04/2022   Vaccine counseling 11/04/2022    Social History: Social History   Socioeconomic History   Marital status: Single    Spouse name: Not on file   Number of children: Not on file   Years of education: Not on file   Highest education level: Not on file  Occupational History   Not on file  Tobacco Use   Smoking status: Never   Smokeless tobacco: Never  Vaping Use   Vaping status: Never Used  Substance and Sexual Activity   Alcohol use: Yes    Comment: weekends   Drug use: No   Sexual activity: Yes    Birth control/protection: None, Condom    Comment:  declined condoms  Other Topics Concern   Not on file  Social History Narrative   Not on file   Social Drivers of Health   Tobacco Use: Low Risk (07/04/2024)   Patient History    Smoking Tobacco Use: Never    Smokeless Tobacco Use: Never    Passive Exposure: Not on file  Financial Resource Strain: Not on file  Food Insecurity: Not on file  Transportation Needs: Not on file  Physical Activity: Not on file  Stress: Not on file  Social Connections: Not on file  Depression (PHQ2-9): Medium Risk (07/04/2024)   Depression (PHQ2-9)    PHQ-2 Score: 5  Alcohol Screen: Not on file  Housing: Not on file  Utilities: Not on file  Health Literacy: Not on file    Labs: Lab Results  Component Value Date   HIV1RNAQUANT NOT DETECTED 07/04/2024   HIV1RNAQUANT Not Detected 10/27/2023   HIV1RNAQUANT Not Detected 09/07/2023   CD4TABS 587 07/04/2024   CD4TABS 712 09/07/2023   CD4TABS 844 11/04/2022    RPR and STI Lab Results  Component Value Date   LABRPR NON-REACTIVE 07/04/2024   LABRPR NON-REACTIVE 09/07/2023   LABRPR NON-REACTIVE 11/04/2022   LABRPR NON-REACTIVE 07/04/2022   LABRPR NON-REACTIVE 01/02/2022    STI Results GC CT  07/04/2024  9:37 AM Negative  Negative   09/07/2023  1:26 PM Negative  Negative   01/01/2023 10:25 AM Positive    Negative  Negative  Negative   11/04/2022 10:39 AM Negative  Negative   07/04/2022 11:34 AM Negative  Negative   10/03/2021  9:54 AM Negative  Negative   12/30/2018 12:00 AM Negative  Negative   06/30/2017 12:00 AM Negative  Negative   03/17/2017 12:00 AM Negative  Negative     Hepatitis B Lab Results  Component Value Date   HEPBSAB REACTIVE (A) 07/09/2023   HEPBSAG Negative 12/30/2018   Hepatitis C Lab Results  Component Value Date   HEPCAB NON-REACTIVE 07/09/2023   Hepatitis A Lab Results  Component Value Date   HAV NON-REACTIVE 07/09/2023   Lipids: Lab Results  Component Value Date   CHOL 185 07/04/2024   TRIG 61  07/04/2024   HDL 47 (L) 07/04/2024   CHOLHDL 3.9 07/04/2024   LDLCALC 123 (H) 07/04/2024    TARGET DATE:  The 17th of the month  Assessment: Reniya presents today for their maintenance Cabenuva  injections. Initial/past injections were tolerated well without issues. No problems with systemic effects of injections.   Administered cabotegravir  600mg /21mL in left upper outer quadrant of the gluteal muscle. Administered rilpivirine  900 mg/3mL in the right upper outer quadrant of the gluteal muscle. Monitored patient for 10 minutes after injection. Injections were tolerated well without issue. Patient will follow up in 2 months for next injection.  Remains eligible for flu, COVID, Shingles, and PCV20 vaccines which she continues to decline.   Plan: - Administer Cabenuva  injections  - Next injections scheduled for 2/17 with Dr. Fleeta Rothman and 4/14 with me  - Call with any issues or questions  Alan Geralds, PharmD, CPP, BCIDP, AAHIVP Clinical Pharmacist Practitioner Infectious Diseases Clinical Pharmacist [facility]      [1] No Known Allergies

## 2024-10-31 ENCOUNTER — Telehealth: Payer: Self-pay

## 2024-10-31 NOTE — Telephone Encounter (Signed)
 [facility] Patient Advocate Encounter  Patient's medications CABENUVA  have been couriered to [facility] from [facility] and will be administered at the patients appointment on 11/01/24.  Charmaine Sharps, CPhT Specialty Pharmacy Patient [facility] for Infect[facility] Phone: [phone] Fax:  [phone]

## 2024-11-01 ENCOUNTER — Other Ambulatory Visit: Payer: Self-pay

## 2024-11-01 ENCOUNTER — Ambulatory Visit: Admitting: Pharmacist

## 2024-11-01 DIAGNOSIS — B20 Human immunodeficiency virus [HIV] disease: Secondary | ICD-10-CM

## 2024-11-01 MED ORDER — CABOTEGRAVIR & RILPIVIRINE ER 600 & 900 MG/3ML IM SUER
1.0000 | Freq: Once | INTRAMUSCULAR | Status: AC
  Administered 2024-11-01: 10:00:00 1 via INTRAMUSCULAR

## 2024-12-22 ENCOUNTER — Other Ambulatory Visit (HOSPITAL_COMMUNITY): Payer: Self-pay

## 2024-12-22 ENCOUNTER — Other Ambulatory Visit: Payer: Self-pay | Admitting: Pharmacist

## 2024-12-22 ENCOUNTER — Other Ambulatory Visit: Payer: Self-pay

## 2024-12-22 DIAGNOSIS — B20 Human immunodeficiency virus [HIV] disease: Secondary | ICD-10-CM

## 2024-12-22 MED ORDER — CABOTEGRAVIR & RILPIVIRINE ER 600 & 900 MG/3ML IM SUER
1.0000 | INTRAMUSCULAR | 5 refills | Status: AC
  Filled 2024-12-22: qty 6, 60d supply, fill #0

## 2024-12-22 NOTE — Progress Notes (Signed)
 Specialty Pharmacy Refill Coordination Note  Beth George is a 29 y.o. female assessed today regarding refills of clinic administered specialty medication(s) Cabotegravir  & Rilpivirine  (CABENUVA )   Clinic requested Courier to Provider Office   Delivery date: 12/29/24   Verified address: [address] [place] [address]   Medication will be filled on 12/28/24.

## 2024-12-23 ENCOUNTER — Other Ambulatory Visit: Payer: Self-pay

## 2025-01-03 ENCOUNTER — Encounter: Payer: Self-pay | Admitting: Infectious Disease

## 2025-02-28 ENCOUNTER — Ambulatory Visit: Payer: Self-pay | Admitting: Pharmacist
